# Patient Record
Sex: Female | Born: 1965 | Race: White | Hispanic: No | Marital: Married | Smoking: Never smoker
Health system: Southern US, Community
[De-identification: ages and names within clinical notes are randomized; demographics above are authoritative.]

---

## 2009-03-11 ENCOUNTER — Ambulatory Visit: Payer: Self-pay | Admitting: Obstetrics and Gynecology

## 2009-09-01 ENCOUNTER — Emergency Department: Payer: Self-pay | Admitting: Emergency Medicine

## 2009-09-08 ENCOUNTER — Emergency Department: Payer: Self-pay | Admitting: Emergency Medicine

## 2011-02-22 ENCOUNTER — Ambulatory Visit: Payer: Self-pay | Admitting: Obstetrics and Gynecology

## 2015-07-14 ENCOUNTER — Other Ambulatory Visit: Payer: Self-pay | Admitting: Family Medicine

## 2015-07-14 DIAGNOSIS — F419 Anxiety disorder, unspecified: Secondary | ICD-10-CM

## 2015-09-27 ENCOUNTER — Other Ambulatory Visit: Payer: Self-pay | Admitting: Family Medicine

## 2015-09-27 DIAGNOSIS — F419 Anxiety disorder, unspecified: Secondary | ICD-10-CM

## 2015-09-27 MED ORDER — SERTRALINE HCL 100 MG PO TABS: 100.0000 mg | ORAL_TABLET | Freq: Every day | ORAL | Status: DC

## 2016-01-23 ENCOUNTER — Encounter: Payer: Self-pay | Admitting: Family Medicine

## 2016-01-23 ENCOUNTER — Ambulatory Visit (INDEPENDENT_AMBULATORY_CARE_PROVIDER_SITE_OTHER): Payer: BLUE CROSS/BLUE SHIELD | Admitting: Family Medicine

## 2016-01-23 VITALS — BP 102/62 | HR 121 | Temp 98.9°F | Resp 17 | Wt 209.4 lb

## 2016-01-23 DIAGNOSIS — S060X9A Concussion with loss of consciousness of unspecified duration, initial encounter: Secondary | ICD-10-CM | POA: Insufficient documentation

## 2016-01-23 DIAGNOSIS — B349 Viral infection, unspecified: Secondary | ICD-10-CM

## 2016-01-23 DIAGNOSIS — F410 Panic disorder [episodic paroxysmal anxiety] without agoraphobia: Secondary | ICD-10-CM | POA: Insufficient documentation

## 2016-01-23 DIAGNOSIS — I889 Nonspecific lymphadenitis, unspecified: Secondary | ICD-10-CM | POA: Insufficient documentation

## 2016-01-23 DIAGNOSIS — S060XAA Concussion with loss of consciousness status unknown, initial encounter: Secondary | ICD-10-CM | POA: Insufficient documentation

## 2016-01-23 DIAGNOSIS — F411 Generalized anxiety disorder: Secondary | ICD-10-CM | POA: Insufficient documentation

## 2016-01-23 DIAGNOSIS — R52 Pain, unspecified: Secondary | ICD-10-CM | POA: Diagnosis not present

## 2016-01-23 DIAGNOSIS — E039 Hypothyroidism, unspecified: Secondary | ICD-10-CM | POA: Insufficient documentation

## 2016-01-23 LAB — POCT INFLUENZA A/B
Influenza A, POC: NEGATIVE
Influenza B, POC: NEGATIVE

## 2016-01-23 MED ORDER — HYDROCODONE-HOMATROPINE 5-1.5 MG/5ML PO SYRP: ORAL_SOLUTION | ORAL | Status: DC

## 2016-01-23 NOTE — Progress Notes (Signed)
Subjective:     Patient ID: Laurie Peterson, female   DOB: 1966/01/17, 50 y.o.   MRN: [id number]  HPI  Chief Complaint  Patient presents with  . Generalized Body Aches    Patient comes in office today with concerns of possibly having the flu virus. Patient reports that symptoms began 24hrs ago and throughout the night she felt feverish, sweats, headache and cough. Patient reports taking Tylenol, Mucinex, Advil and Vitamin C  States her husband has been sick and she has not had the flu vaccine. She work in a daycare. Review of Systems     Objective:   Physical Exam  Constitutional: She appears well-developed and well-nourished. No distress.  Ears: T.M's intact without inflammation Throat: no tonsillar enlargement or exudate Neck: no cervical adenopathy Lungs: clear with frequent cough in inspiration     Assessment:    1. Body aches - POCT Influenza A/B  2. Viral syndrome - HYDROcodone-homatropine (HYCODAN) 5-1.5 MG/5ML syrup; 5 ml 4-6 hours as needed for cough  Dispense: 240 mL; Refill: 0   Plan:    Discussed continued use of otc medication.

## 2016-01-23 NOTE — Patient Instructions (Signed)
Continue Mucinex and decongestants as needed. Consider Delsym for a non-sedating cough syrup.

## 2016-03-12 ENCOUNTER — Other Ambulatory Visit: Payer: Self-pay | Admitting: Family Medicine

## 2016-03-12 ENCOUNTER — Telehealth: Payer: Self-pay

## 2016-03-12 DIAGNOSIS — E039 Hypothyroidism, unspecified: Secondary | ICD-10-CM

## 2016-03-12 MED ORDER — LEVOTHYROXINE SODIUM 137 MCG PO TABS: 137.0000 ug | ORAL_TABLET | Freq: Every day | ORAL | Status: DC

## 2016-03-12 NOTE — Telephone Encounter (Signed)
[id number] 

## 2016-03-12 NOTE — Telephone Encounter (Signed)
-----   Message from Anola Gurneyobert Chauvin, [place] sent at 03/12/2016  2:02 PM EDT ----- I have refilled her thyroid medication but needs to update her labs. I sent a lab request to [facility]. She is to let us know if not there and we can print it out.

## 2016-03-13 NOTE — Telephone Encounter (Signed)
[id number] 

## 2016-03-15 NOTE — Telephone Encounter (Signed)
[id number] 

## 2016-03-21 NOTE — Telephone Encounter (Signed)
[id number] 

## 2016-06-04 ENCOUNTER — Other Ambulatory Visit: Payer: Self-pay | Admitting: Family Medicine

## 2016-06-04 DIAGNOSIS — F419 Anxiety disorder, unspecified: Secondary | ICD-10-CM

## 2016-06-04 MED ORDER — SERTRALINE HCL 100 MG PO TABS: 100.0000 mg | ORAL_TABLET | Freq: Every day | ORAL | Status: DC

## 2016-06-20 ENCOUNTER — Telehealth: Payer: Self-pay | Admitting: Family Medicine

## 2016-06-20 NOTE — Telephone Encounter (Signed)
Pt is requesting a lab slip for thyroid rechecked.  ZO#[phone]/WJCB#[phone]/MW

## 2016-06-20 NOTE — Telephone Encounter (Signed)
Left message that lab slip is ready for pick up

## 2016-06-20 NOTE — Telephone Encounter (Signed)
Lab slip up front for pickup. 

## 2016-06-21 DIAGNOSIS — E039 Hypothyroidism, unspecified: Secondary | ICD-10-CM | POA: Diagnosis not present

## 2016-06-29 ENCOUNTER — Other Ambulatory Visit: Payer: Self-pay | Admitting: Family Medicine

## 2016-06-29 DIAGNOSIS — E039 Hypothyroidism, unspecified: Secondary | ICD-10-CM

## 2016-06-29 DIAGNOSIS — F419 Anxiety disorder, unspecified: Secondary | ICD-10-CM

## 2016-06-29 MED ORDER — SERTRALINE HCL 100 MG PO TABS: 100.0000 mg | ORAL_TABLET | Freq: Every day | ORAL | Status: DC

## 2016-06-29 MED ORDER — LEVOTHYROXINE SODIUM 137 MCG PO TABS: 137.0000 ug | ORAL_TABLET | Freq: Every day | ORAL | Status: DC

## 2016-08-23 ENCOUNTER — Other Ambulatory Visit: Payer: Self-pay | Admitting: Family Medicine

## 2016-08-23 DIAGNOSIS — F419 Anxiety disorder, unspecified: Secondary | ICD-10-CM

## 2016-10-02 ENCOUNTER — Other Ambulatory Visit: Payer: Self-pay | Admitting: Family Medicine

## 2016-10-02 DIAGNOSIS — F419 Anxiety disorder, unspecified: Secondary | ICD-10-CM

## 2016-10-02 NOTE — Telephone Encounter (Signed)
Pt needs refill on her sertraline 100mg .  She is completely out.  She has an appt with Nadine CountsBob on Tuesday.  She [place] [facility] in [place]    Pt call back is [phone]  Thank s Barth Kirksteri

## 2016-10-02 NOTE — Telephone Encounter (Signed)
Please advise 

## 2016-10-03 NOTE — Telephone Encounter (Signed)
Patient has an appt with [name] 10/09/2016.

## 2016-10-09 ENCOUNTER — Encounter: Payer: Self-pay | Admitting: Family Medicine

## 2016-10-09 ENCOUNTER — Ambulatory Visit (INDEPENDENT_AMBULATORY_CARE_PROVIDER_SITE_OTHER): Payer: BLUE CROSS/BLUE SHIELD | Admitting: Family Medicine

## 2016-10-09 VITALS — BP 104/70 | HR 68 | Temp 98.0°F | Resp 16 | Wt 209.4 lb

## 2016-10-09 DIAGNOSIS — E039 Hypothyroidism, unspecified: Secondary | ICD-10-CM

## 2016-10-09 DIAGNOSIS — F419 Anxiety disorder, unspecified: Secondary | ICD-10-CM

## 2016-10-09 MED ORDER — LEVOTHYROXINE SODIUM 137 MCG PO TABS: 137.0000 ug | ORAL_TABLET | Freq: Every day | ORAL | 3 refills | Status: DC

## 2016-10-09 MED ORDER — SERTRALINE HCL 100 MG PO TABS: 100.0000 mg | ORAL_TABLET | Freq: Every day | ORAL | 3 refills | Status: DC

## 2016-10-09 NOTE — Patient Instructions (Signed)
Please update your health maintenance with your gyn. Consider colonoscopy in the next year.

## 2016-10-09 NOTE — Progress Notes (Signed)
Subjective:     Patient ID: Laurie Peterson, female   DOB: [birth year], 50 y.o.   MRN: [id number]  HPI  Chief Complaint  Patient presents with  . 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 Reports excellent control of anxiety with current sertraline dose and wishes to continue. Recent thyroid labs reviewed and stable. Defers flu shot and colonoscopy at this time. Encouraged her to [facility] where [facility] has not been in 4 years. States they usually update labs as well.    Review of Systems     Objective:   Physical Exam  Constitutional: She appears well-developed and well-nourished. No distress.  pleasant  Psychiatric: She has a normal mood and affect. Her behavior is normal.       Assessment:    1. Adult hypothyroidism - levothyroxine (SYNTHROID, LEVOTHROID) 137 MCG tablet; Take 1 tablet (137 mcg total) by mouth daily before breakfast.  Dispense: 90 tablet; Refill: 3  2. Anxiety - sertraline (ZOLOFT) 100 MG tablet; Take 1 tablet (100 mg total) by mouth daily.  Dispense: 90 tablet; Refill: 3    Plan:    Encouraged health maintenance.

## 2016-10-10 ENCOUNTER — Encounter: Payer: Self-pay | Admitting: Family Medicine

## 2017-10-11 ENCOUNTER — Other Ambulatory Visit: Payer: Self-pay | Admitting: Family Medicine

## 2017-10-11 DIAGNOSIS — E039 Hypothyroidism, unspecified: Secondary | ICD-10-CM

## 2017-10-11 DIAGNOSIS — F419 Anxiety disorder, unspecified: Secondary | ICD-10-CM

## 2017-10-14 ENCOUNTER — Telehealth: Payer: Self-pay | Admitting: Family Medicine

## 2017-10-14 DIAGNOSIS — Z23 Encounter for immunization: Secondary | ICD-10-CM | POA: Diagnosis not present

## 2017-10-14 NOTE — Telephone Encounter (Signed)
[facility] faxed a request for a refill on the following medication. Thanks CC  levothyroxine (SYNTHROID, LEVOTHROID) 137 MCG tablet

## 2017-10-15 NOTE — Telephone Encounter (Signed)
Filled 09/1917. KW

## 2017-11-26 ENCOUNTER — Other Ambulatory Visit: Payer: Self-pay | Admitting: Family Medicine

## 2017-11-26 ENCOUNTER — Other Ambulatory Visit: Payer: Self-pay | Admitting: Obstetrics and Gynecology

## 2017-11-26 DIAGNOSIS — Z0000 Encounter for general adult medical examination without abnormal findings: Secondary | ICD-10-CM | POA: Diagnosis not present

## 2017-11-26 DIAGNOSIS — Z1231 Encounter for screening mammogram for malignant neoplasm of breast: Secondary | ICD-10-CM | POA: Diagnosis not present

## 2017-11-26 DIAGNOSIS — Z1211 Encounter for screening for malignant neoplasm of colon: Secondary | ICD-10-CM | POA: Diagnosis not present

## 2017-11-26 DIAGNOSIS — F419 Anxiety disorder, unspecified: Secondary | ICD-10-CM

## 2017-11-26 DIAGNOSIS — Z01419 Encounter for gynecological examination (general) (routine) without abnormal findings: Secondary | ICD-10-CM | POA: Diagnosis not present

## 2017-11-26 LAB — HM PAP SMEAR: HM Pap smear: NEGATIVE

## 2017-11-27 ENCOUNTER — Other Ambulatory Visit: Payer: Self-pay | Admitting: Family Medicine

## 2017-11-27 NOTE — Telephone Encounter (Signed)
Prescription was filled on 11/26/17 and [facility].KW

## 2017-11-27 NOTE — Telephone Encounter (Signed)
[facility] faxed a refill request for the following medication. Thanks CC  sertraline (ZOLOFT) 100 MG tablet

## 2017-12-05 NOTE — Telephone Encounter (Signed)
[facility] called about refill. I advised that b/c pt hasn't requested us to [facility] pt would need to call and request the Rx. I tried to reach out to pt but no answer and no voicemail. Please advise. Thanks TNP

## 2017-12-05 NOTE — Telephone Encounter (Signed)
Unable to reach patient at this time no voice answering service. KW

## 2017-12-06 ENCOUNTER — Other Ambulatory Visit: Payer: Self-pay | Admitting: Family Medicine

## 2017-12-06 DIAGNOSIS — F419 Anxiety disorder, unspecified: Secondary | ICD-10-CM

## 2017-12-06 MED ORDER — SERTRALINE HCL 100 MG PO TABS: 100.0000 mg | ORAL_TABLET | Freq: Every day | ORAL | 0 refills | Status: DC

## 2017-12-06 NOTE — Telephone Encounter (Signed)
Patient advised.KW 

## 2017-12-06 NOTE — Telephone Encounter (Signed)
Patient states that she didn't get a chance to pick up prescription on 11/26/17 and that she only had a one day window with her insurance to get Rx at pharmacy or it would not be covered under insurance. Patient states that insurance told her in order for medication to be covered it would have to be mail ordered. Patient is [facility]. KW

## 2017-12-06 NOTE — Telephone Encounter (Signed)
Pt is returning call.  Pt gave a new Phone #.  ZO#[phone]/WJCB#[phone]/MW

## 2017-12-06 NOTE — Telephone Encounter (Signed)
I have sent in sertraline to [facility].Still need to see her before further refills for anxiety and her thyroid.

## 2017-12-09 ENCOUNTER — Other Ambulatory Visit: Payer: Self-pay | Admitting: Family Medicine

## 2017-12-09 DIAGNOSIS — E039 Hypothyroidism, unspecified: Secondary | ICD-10-CM

## 2017-12-18 ENCOUNTER — Inpatient Hospital Stay: Payer: BLUE CROSS/BLUE SHIELD | Admitting: Family Medicine

## 2017-12-25 ENCOUNTER — Ambulatory Visit
Admission: RE | Admit: 2017-12-25 | Discharge: 2017-12-25 | Disposition: A | Discharge status: Home / Self Care | Payer: BLUE CROSS/BLUE SHIELD | Source: Ambulatory Visit | Attending: Obstetrics and Gynecology | Admitting: Obstetrics and Gynecology

## 2017-12-25 DIAGNOSIS — Z1231 Encounter for screening mammogram for malignant neoplasm of breast: Secondary | ICD-10-CM | POA: Diagnosis not present

## 2018-01-01 DIAGNOSIS — Z01818 Encounter for other preprocedural examination: Secondary | ICD-10-CM | POA: Diagnosis not present

## 2018-01-01 DIAGNOSIS — Z8371 Family history of colonic polyps: Secondary | ICD-10-CM | POA: Diagnosis not present

## 2018-01-01 DIAGNOSIS — Z1211 Encounter for screening for malignant neoplasm of colon: Secondary | ICD-10-CM | POA: Diagnosis not present

## 2018-01-24 ENCOUNTER — Other Ambulatory Visit: Payer: Self-pay | Admitting: Family Medicine

## 2018-01-24 DIAGNOSIS — F419 Anxiety disorder, unspecified: Secondary | ICD-10-CM

## 2018-02-14 DIAGNOSIS — K648 Other hemorrhoids: Secondary | ICD-10-CM | POA: Diagnosis not present

## 2018-02-14 DIAGNOSIS — Z1211 Encounter for screening for malignant neoplasm of colon: Secondary | ICD-10-CM | POA: Diagnosis not present

## 2018-02-14 DIAGNOSIS — Z8371 Family history of colonic polyps: Secondary | ICD-10-CM | POA: Diagnosis not present

## 2018-02-14 LAB — HM COLONOSCOPY

## 2018-02-26 ENCOUNTER — Encounter: Payer: Self-pay | Admitting: Physician Assistant

## 2018-02-26 ENCOUNTER — Ambulatory Visit (INDEPENDENT_AMBULATORY_CARE_PROVIDER_SITE_OTHER): Payer: BLUE CROSS/BLUE SHIELD | Admitting: Physician Assistant

## 2018-02-26 VITALS — BP 110/82 | HR 90 | Temp 97.9°F | Resp 20 | Wt 212.0 lb

## 2018-02-26 DIAGNOSIS — R05 Cough: Secondary | ICD-10-CM | POA: Diagnosis not present

## 2018-02-26 DIAGNOSIS — R059 Cough, unspecified: Secondary | ICD-10-CM

## 2018-02-26 DIAGNOSIS — E039 Hypothyroidism, unspecified: Secondary | ICD-10-CM

## 2018-02-26 NOTE — Progress Notes (Signed)
Patient: Laurie Peterson Female    DOB: 1966/03/16   52 y.o.   MRN: [id number] Visit Date: 02/26/2018  Today's Provider: Trey SailorsAdriana M Rainna Nearhood, PA-C   Chief Complaint  Patient presents with  . Cough   Subjective:    Laurie Mainlandara M Erhart is a 52 y/o woman presenting today for a cough x 1 day and hypothyroid.   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 She has had dry cough x 1 day with pertinent info below.   She has a history of hypothyroidism. She is on 137 mcg synthroid. Her last TSH is not visible in Epic. She normally sees Nadine CountsBob who declined refilling her medications before her getting more labs. She denies fatigue, weight gain, trouble swallowing.  Cough  This is a new problem. The current episode started yesterday. The problem has been gradually worsening. The cough is productive of sputum. Associated symptoms include headaches, nasal congestion and postnasal drip. Pertinent negatives include no fever. She has tried nothing for the symptoms. There is no history of asthma or environmental allergies.       No Known Allergies   Current Outpatient Medications:  .  levothyroxine (SYNTHROID, LEVOTHROID) 137 MCG tablet, TAKE 1 TABLET BY MOUTH  DAILY BEFORE BREAKFAST, Disp: 90 tablet, Rfl: 0 .  sertraline (ZOLOFT) 100 MG tablet, Take 1 tablet (100 mg total) by mouth daily., Disp: 90 tablet, Rfl: 0  Review of Systems  Constitutional: Negative for fever.  HENT: Positive for postnasal drip.   Respiratory: Positive for cough.   Allergic/Immunologic: Negative for environmental allergies.  Neurological: Positive for headaches.    Social History   Tobacco Use  . Smoking status: Never Smoker  . Smokeless tobacco: Never Used  Substance Use Topics  . Alcohol use: No    Alcohol/week: 0.0 oz   Objective:   BP 110/82   Pulse 90   Temp 97.9 F (36.6 C)   Resp 20   Wt 212 lb (96.2 kg)   SpO2 98%  Vitals:   02/26/18 1450  BP: 110/82  Pulse: 90  Resp: 20  Temp: 97.9  F (36.6 C)  SpO2: 98%  Weight: 212 lb (96.2 kg)     Physical Exam  Constitutional: She appears well-developed and well-nourished.  HENT:  Right Ear: External ear normal.  Left Ear: External ear normal.  Mouth/Throat: Oropharynx is clear and moist. No oropharyngeal exudate.  Eyes: Right eye exhibits discharge. Left eye exhibits discharge.  Neck: Neck supple. No thyromegaly present.  Cardiovascular: Normal rate and regular rhythm.  Pulmonary/Chest: Effort normal and breath sounds normal. No respiratory distress. She has no rales.  Lymphadenopathy:    She has no cervical adenopathy.  Skin: Skin is warm and dry.  Psychiatric: She has a normal mood and affect. Her behavior is normal.        Assessment & Plan:     1. Cough  Viral URI, counseled on OTC medications.  2. Adult hypothyroidism  Stressed importance of following up with labwork for hypothyroidism, which is chronic disease.  - TSH  Return in about 6 months (around 08/29/2018) for hypothyroidism.  The entirety of the information documented in the History of Present Illness, Review of Systems and Physical Exam were personally obtained by me. Portions of this information were initially documented by Thresa Rossachelle P, CMA and reviewed by me for thoroughness and accuracy.         Trey SailorsAdriana M Yeraldine Forney, PA-C  [facility]  

## 2018-02-26 NOTE — Patient Instructions (Signed)
Hypothyroidism Hypothyroidism is a disorder of the thyroid. The thyroid is a large gland that is located in the lower front of the neck. The thyroid releases hormones that control how the body works. With hypothyroidism, the thyroid does not make enough of these hormones. What are the causes? Causes of hypothyroidism may include:  Viral infections.  Pregnancy.  Your own defense system (immune system) attacking your thyroid.  Certain medicines.  Birth defects.  Past radiation treatments to your head or neck.  Past treatment with radioactive iodine.  Past surgical removal of part or all of your thyroid.  Problems with the gland that is located in the center of your brain (pituitary).  What are the signs or symptoms? Signs and symptoms of hypothyroidism may include:  Feeling as though you have no energy (lethargy).  Inability to tolerate cold.  Weight gain that is not explained by a change in diet or exercise habits.  Dry skin.  Coarse hair.  Menstrual irregularity.  Slowing of thought processes.  Constipation.  Sadness or depression.  How is this diagnosed? Your health care provider may diagnose hypothyroidism with blood tests and ultrasound tests. How is this treated? Hypothyroidism is treated with medicine that replaces the hormones that your body does not make. After you begin treatment, it may take several weeks for symptoms to go away. Follow these instructions at home:  Take medicines only as directed by your health care provider.  If you start taking any new medicines, tell your health care provider.  Keep all follow-up visits as directed by your health care provider. This is important. As your condition improves, your dosage needs may change. You will need to have blood tests regularly so that your health care provider can watch your condition. Contact a health care provider if:  Your symptoms do not get better with treatment.  You are taking thyroid  replacement medicine and: ? You sweat excessively. ? You have tremors. ? You feel anxious. ? You lose weight rapidly. ? You cannot tolerate heat. ? You have emotional swings. ? You have diarrhea. ? You feel weak. Get help right away if:  You develop chest pain.  You develop an irregular heartbeat.  You develop a rapid heartbeat. This information is not intended to replace advice given to you by your health care provider. Make sure you discuss any questions you have with your health care provider. Document Released: 12/10/2005 Document Revised: 05/17/2016 Document Reviewed: 04/27/2014 Elsevier Interactive Patient Education  2018 Elsevier Inc.  

## 2018-02-27 ENCOUNTER — Other Ambulatory Visit: Payer: Self-pay | Admitting: Family Medicine

## 2018-02-27 ENCOUNTER — Telehealth: Payer: Self-pay

## 2018-02-27 DIAGNOSIS — E039 Hypothyroidism, unspecified: Secondary | ICD-10-CM

## 2018-02-27 LAB — TSH: TSH: 3.83 u[IU]/mL (ref 0.450–4.500)

## 2018-02-27 MED ORDER — LEVOTHYROXINE SODIUM 137 MCG PO TABS: ORAL_TABLET | ORAL | 3 refills | Status: DC

## 2018-02-27 NOTE — Telephone Encounter (Signed)
Patient has been advised. KW 

## 2018-02-27 NOTE — Telephone Encounter (Signed)
-----   Message from Anola Gurneyobert Chauvin, [place] sent at 02/27/2018  1:27 PM EST ----- Let patient know I have sent in thyroid [facility] as her thyroid test was ok.

## 2018-03-03 ENCOUNTER — Ambulatory Visit (INDEPENDENT_AMBULATORY_CARE_PROVIDER_SITE_OTHER): Payer: BLUE CROSS/BLUE SHIELD | Admitting: Family Medicine

## 2018-03-03 ENCOUNTER — Encounter: Payer: Self-pay | Admitting: Family Medicine

## 2018-03-03 VITALS — BP 122/90 | HR 106 | Temp 98.2°F | Resp 16

## 2018-03-03 DIAGNOSIS — J0100 Acute maxillary sinusitis, unspecified: Secondary | ICD-10-CM

## 2018-03-03 MED ORDER — AMOXICILLIN-POT CLAVULANATE 875-125 MG PO TABS: 1.0000 | ORAL_TABLET | Freq: Two times a day (BID) | ORAL | 0 refills | Status: DC

## 2018-03-03 NOTE — Patient Instructions (Signed)
Discussed use of Mucinex D for congestion and Delsym for cough. Let me know if you wish to go down on your sertraline to 50 mg.at next refill.

## 2018-03-03 NOTE — Progress Notes (Signed)
Subjective:     Patient ID: Laurie Peterson, female   DOB: 1966-06-09, 52 y.o.   MRN: [id number] Chief Complaint  Patient presents with  . Sinus Problem    Patient comes into office today with complaints of sinus pain and pressure for the past 5 days, patient reports Saturday symptoms got worse. Patient reports that she has developed a cough and has been taking otc Sudafed, she denies shortness of breath or wheezing. Marland Kitchen.    HPI Developed cold sx one weeks ago. Patient reports increased right sided sinus pressure, purulent sinus drainage, post nasal drainage and accompanying cough. Has been taking Sudafed and Theraflu for her sx. Continues to work in a daycare Review of Systems  Constitutional:       Health maintenance thorough [place] including colonoscopy.       Objective:   Physical Exam  Constitutional: She appears well-developed and well-nourished.  Ears: T.M's intact without inflammation Sinuses: moderate right maxillary sinus tenderness Throat: no tonsillar enlargement or exudate Neck: no cervical adenopathy Lungs: clear     Assessment:    1. Acute maxillary sinusitis, recurrence not specified - amoxicillin-clavulanate (AUGMENTIN) 875-125 MG tablet; Take 1 tablet by mouth 2 (two) times daily.    Plan:    Discussed use of Mucinex D and Delsym. She will call regarding success with tapering sertraline to 50 mg at time of refill.

## 2018-04-07 ENCOUNTER — Telehealth: Payer: Self-pay | Admitting: Family Medicine

## 2018-04-07 ENCOUNTER — Other Ambulatory Visit: Payer: Self-pay | Admitting: Family Medicine

## 2018-04-07 DIAGNOSIS — F419 Anxiety disorder, unspecified: Secondary | ICD-10-CM

## 2018-04-07 MED ORDER — SERTRALINE HCL 100 MG PO TABS: 100.0000 mg | ORAL_TABLET | Freq: Every day | ORAL | 0 refills | Status: DC

## 2018-04-07 NOTE — Telephone Encounter (Signed)
States she does not take it every day and is willing to try 1/2 pill. She will call about tapering further.

## 2018-04-07 NOTE — Telephone Encounter (Signed)
According to last refill on 12/06/17 it stated in comments that patient need office visit, I called patient to schedule follow up for anxiety and she states that she was just seen in office on 03/03/18. I advised patient she was seen for a sick visit and not follow up and patient states that you told her that you would accept her follow up at that visit. Please review note and advise. KW

## 2018-04-07 NOTE — Telephone Encounter (Signed)
Pt needs a refill on her Sertraline 100.mg   She uses [facility]  Pt's call back is [phone]  Thanks Fortune Brandsteri

## 2018-05-26 ENCOUNTER — Other Ambulatory Visit: Payer: Self-pay | Admitting: Family Medicine

## 2018-05-26 DIAGNOSIS — F419 Anxiety disorder, unspecified: Secondary | ICD-10-CM

## 2018-08-26 ENCOUNTER — Other Ambulatory Visit: Payer: Self-pay | Admitting: Family Medicine

## 2018-08-26 ENCOUNTER — Telehealth: Payer: Self-pay | Admitting: Family Medicine

## 2018-08-26 MED ORDER — SERTRALINE HCL 50 MG PO TABS: 50.0000 mg | ORAL_TABLET | Freq: Every day | ORAL | 1 refills | Status: DC

## 2018-08-26 NOTE — Telephone Encounter (Signed)
Needs refill on Sertraline 50 mg. To online pharmacy [facility]

## 2018-08-26 NOTE — Telephone Encounter (Signed)
done

## 2018-08-26 NOTE — Telephone Encounter (Signed)
Medication last filled 05/26/18, last office visit for anxiety was 10/09/16.KW

## 2018-08-26 NOTE — Telephone Encounter (Signed)
She has been on 100 mg. Is she trying to taper down to 50 mg?

## 2018-08-26 NOTE — Telephone Encounter (Signed)
Patient states that she tapered her self down to 50mg  3 months ago stating that her symptoms have greatly improved and request to continue on the 50mg . Renette Butters

## 2018-11-11 ENCOUNTER — Encounter: Payer: Self-pay | Admitting: Family Medicine

## 2018-11-11 ENCOUNTER — Ambulatory Visit (INDEPENDENT_AMBULATORY_CARE_PROVIDER_SITE_OTHER): Payer: BLUE CROSS/BLUE SHIELD | Admitting: Family Medicine

## 2018-11-11 VITALS — BP 116/64 | HR 60 | Temp 98.1°F | Resp 16 | Wt 214.4 lb

## 2018-11-11 DIAGNOSIS — J40 Bronchitis, not specified as acute or chronic: Secondary | ICD-10-CM

## 2018-11-11 MED ORDER — AZITHROMYCIN 250 MG PO TABS: ORAL_TABLET | ORAL | 0 refills | Status: DC

## 2018-11-11 NOTE — Patient Instructions (Signed)
Start Mucinex DM. Let me know if not improving.

## 2018-11-11 NOTE — Progress Notes (Signed)
  Subjective:     Patient ID: Laurie Peterson, female   DOB: 1966-01-12, 52 y.o.   MRN: [id number] Chief Complaint  Patient presents with  . Cough    Patient comes in office today with complaints of cough and chest congestion x2 weeks. Patient states associated with cough she has had shortness of breath and wheezing. Patien has been taking otc Mucinex D.    HPI States she has not had fever, chills, sore throat or sinus congestion. She intends to get the flu shot when she gets better  Review of Systems  Psychiatric/Behavioral:       Anxiety controlled by sertraline. Does not wish to taper during the holidays       Objective:   Physical Exam  Constitutional: She appears well-developed and well-nourished. No distress.  Ears: T.M's intact without inflammation Throat: no tonsillar enlargement or exudate Neck: no cervical adenopathy Lungs: mild posterior lung field wheezes     Assessment:    1. Bronchitis: Z pack     Plan:    Samples of Mucinex DM

## 2018-11-12 ENCOUNTER — Ambulatory Visit: Payer: Self-pay | Admitting: Physician Assistant

## 2019-01-07 ENCOUNTER — Other Ambulatory Visit: Payer: Self-pay | Admitting: Obstetrics & Gynecology

## 2019-01-07 DIAGNOSIS — E039 Hypothyroidism, unspecified: Secondary | ICD-10-CM | POA: Diagnosis not present

## 2019-01-07 DIAGNOSIS — Z1211 Encounter for screening for malignant neoplasm of colon: Secondary | ICD-10-CM | POA: Diagnosis not present

## 2019-01-07 DIAGNOSIS — Z01419 Encounter for gynecological examination (general) (routine) without abnormal findings: Secondary | ICD-10-CM | POA: Diagnosis not present

## 2019-01-07 DIAGNOSIS — Z13220 Encounter for screening for lipoid disorders: Secondary | ICD-10-CM | POA: Diagnosis not present

## 2019-01-07 DIAGNOSIS — Z1231 Encounter for screening mammogram for malignant neoplasm of breast: Secondary | ICD-10-CM

## 2019-01-07 DIAGNOSIS — R5383 Other fatigue: Secondary | ICD-10-CM | POA: Diagnosis not present

## 2019-01-07 DIAGNOSIS — Z0000 Encounter for general adult medical examination without abnormal findings: Secondary | ICD-10-CM | POA: Diagnosis not present

## 2019-01-07 LAB — BASIC METABOLIC PANEL
BUN: 15 (ref 4–21)
Creatinine: 0.7 (ref 0.5–1.1)
GLUCOSE: 82
Potassium: 4 (ref 3.4–5.3)
Sodium: 139 (ref 137–147)

## 2019-01-07 LAB — LIPID PANEL
Cholesterol: 195 (ref 0–200)
HDL: 32 — AB (ref 35–70)
LDL CALC: 122
Triglycerides: 206 — AB (ref 40–160)

## 2019-01-07 LAB — TSH: TSH: 0.78 (ref 0.41–5.90)

## 2019-01-07 LAB — HEPATIC FUNCTION PANEL
ALT: 10 (ref 7–35)
AST: 11 — AB (ref 13–35)
Alkaline Phosphatase: 50 (ref 25–125)
Bilirubin, Total: 0.5

## 2019-01-07 LAB — CBC AND DIFFERENTIAL
HEMATOCRIT: 42 (ref 36–46)
HEMOGLOBIN: 13.7 (ref 12.0–16.0)
PLATELETS: 267 (ref 150–399)
WBC: 6.4

## 2019-01-07 LAB — VITAMIN B12: VITAMIN B 12: 316

## 2019-01-07 LAB — FECAL OCCULT BLOOD, GUAIAC: Fecal Occult Blood: NEGATIVE

## 2019-01-08 ENCOUNTER — Other Ambulatory Visit: Payer: Self-pay | Admitting: Family Medicine

## 2019-01-08 ENCOUNTER — Telehealth: Payer: Self-pay | Admitting: Family Medicine

## 2019-01-08 MED ORDER — LEVOTHYROXINE SODIUM 137 MCG PO CAPS: 1.0000 | ORAL_CAPSULE | Freq: Every day | ORAL | 0 refills | Status: DC

## 2019-01-08 NOTE — Telephone Encounter (Signed)
Pt having insurance coverage issues.  Pt asking for 2 week supply refill until her insurance is corrected.  Thanks, [employer]

## 2019-01-08 NOTE — Telephone Encounter (Signed)
Please review

## 2019-01-08 NOTE — Telephone Encounter (Signed)
done

## 2019-01-23 ENCOUNTER — Other Ambulatory Visit: Payer: Self-pay | Admitting: Family Medicine

## 2019-01-23 ENCOUNTER — Telehealth: Payer: Self-pay

## 2019-01-23 MED ORDER — LEVOTHYROXINE SODIUM 137 MCG PO CAPS: 1.0000 | ORAL_CAPSULE | Freq: Every day | ORAL | 0 refills | Status: DC

## 2019-01-23 NOTE — Telephone Encounter (Signed)
Please review

## 2019-01-23 NOTE — Telephone Encounter (Signed)
Patient called office requesting refill on Levothyroxine. Patient reports that she is still dealing with coverage for health insurance and is requesting that you call in a 2 week prescription for Levothyroxine to [facility] in [place]. Please review KW

## 2019-01-23 NOTE — Telephone Encounter (Signed)
done

## 2019-01-27 ENCOUNTER — Ambulatory Visit
Admission: RE | Admit: 2019-01-27 | Discharge: 2019-01-27 | Disposition: A | Discharge status: Home / Self Care | Payer: BLUE CROSS/BLUE SHIELD | Source: Ambulatory Visit | Attending: Obstetrics & Gynecology | Admitting: Obstetrics & Gynecology

## 2019-01-27 DIAGNOSIS — Z1231 Encounter for screening mammogram for malignant neoplasm of breast: Secondary | ICD-10-CM | POA: Insufficient documentation

## 2019-02-20 ENCOUNTER — Encounter: Payer: Self-pay | Admitting: Family Medicine

## 2019-02-20 ENCOUNTER — Ambulatory Visit (INDEPENDENT_AMBULATORY_CARE_PROVIDER_SITE_OTHER): Payer: BLUE CROSS/BLUE SHIELD | Admitting: Family Medicine

## 2019-02-20 VITALS — BP 116/71 | HR 79 | Temp 97.7°F | Wt 191.0 lb

## 2019-02-20 DIAGNOSIS — H109 Unspecified conjunctivitis: Secondary | ICD-10-CM

## 2019-02-20 DIAGNOSIS — B9689 Other specified bacterial agents as the cause of diseases classified elsewhere: Secondary | ICD-10-CM

## 2019-02-20 MED ORDER — POLYMYXIN B-TRIMETHOPRIM 10000-0.1 UNIT/ML-% OP SOLN: 2.0000 [drp] | Freq: Four times a day (QID) | OPHTHALMIC | 0 refills | Status: AC

## 2019-02-20 NOTE — Patient Instructions (Signed)
Bacterial Conjunctivitis, Adult  Bacterial conjunctivitis is an infection of your conjunctiva. This is the clear membrane that covers the white part of your eye and the inner part of your eyelid. This infection can make your eye:  · Red or pink.  · Itchy.  This condition spreads easily from person to person (is contagious) and from one eye to the other eye.  What are the causes?  · This condition is caused by germs (bacteria). You may get the infection if you come into close contact with:  ? A person who has the infection.  ? Items that have germs on them (are contaminated), such as face towels, contact lens solution, or eye makeup.  What increases the risk?  You are more likely to get this condition if you:  · Have contact with people who have the infection.  · Wear contact lenses.  · Have a sinus infection.  · Have had a recent eye injury or surgery.  · Have a weak body defense system (immune system).  · Have dry eyes.  What are the signs or symptoms?    · Thick, yellowish discharge from the eye.  · Tearing or watery eyes.  · Itchy eyes.  · Burning feeling in your eyes.  · Eye redness.  · Swollen eyelids.  · Blurred vision.  How is this treated?    · Antibiotic eye drops or ointment.  · Antibiotic medicine taken by mouth. This is used for infections that do not get better with drops or ointment or that last more than 10 days.  · Cool, wet cloths placed on the eyes.  · Artificial tears used 2-6 times a day.  Follow these instructions at home:  Medicines  · Take or apply your antibiotic medicine as told by your doctor. Do not stop taking or applying the antibiotic even if you start to feel better.  · Take or apply over-the-counter and prescription medicines only as told by your doctor.  · Do not touch your eyelid with the eye-drop bottle or the ointment tube.  Managing discomfort  · Wipe any fluid from your eye with a warm, wet washcloth or a cotton ball.  · Place a clean, cool, wet cloth on your eye. Do this for  10-20 minutes, 3-4 times per day.  General instructions  · Do not wear contacts until the infection is gone. Wear glasses until your doctor says it is okay to wear contacts again.  · Do not wear eye makeup until the infection is gone. Throw away old eye makeup.  · Change or wash your pillowcase every day.  · Do not share towels or washcloths.  · Wash your hands often with soap and water. Use paper towels to dry your hands.  · Do not touch or rub your eyes.  · Do not drive or use heavy machinery if your vision is blurred.  Contact a doctor if:  · You have a fever.  · You do not get better after 10 days.  Get help right away if:  · You have a fever and your symptoms get worse all of a sudden.  · You have very bad pain when you move your eye.  · Your face:  ? Hurts.  ? Is red.  ? Is swollen.  · You have sudden loss of vision.  Summary  · Bacterial conjunctivitis is an infection of your conjunctiva.  · This infection spreads easily from person to person.  · Wash your hands often   with soap and water. Use paper towels to dry your hands.  · Take or apply your antibiotic medicine as told by your doctor.  · Contact a doctor if you have a fever or you do not get better after 10 days.  This information is not intended to replace advice given to you by your health care provider. Make sure you discuss any questions you have with your health care provider.  Document Released: 09/18/2008 Document Revised: 07/16/2018 Document Reviewed: 07/16/2018  Elsevier Interactive Patient Education © 2019 Elsevier Inc.

## 2019-02-20 NOTE — Progress Notes (Signed)
Patient: Laurie Peterson Female    DOB: 04-20-1966   53 y.o.   MRN: [id number] Visit Date: 02/20/2019  Today's Provider: Shirlee Latch, MD   Chief Complaint  Patient presents with  . Eye Problem   Subjective:    I, Presley Raddle, CMA, am acting as a scribe for Shirlee Latch, MD.   Eye Problem   The right eye is affected. This is a new problem. The current episode started today. The problem occurs constantly. There was no injury mechanism. There is known exposure to pink eye. She does not wear contacts. Associated symptoms include an eye discharge, eye redness and itching. She has tried nothing for the symptoms.   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  Levothyroxine Sodium 137 MCG CAPS, Take 1 capsule (137 mcg total) by mouth daily before breakfast., Disp: 14 capsule, Rfl: 0 .  sertraline (ZOLOFT) 50 MG tablet, Take 1 tablet (50 mg total) by mouth daily., Disp: 90 tablet, Rfl: 1   Review of Systems  Constitutional: Negative.   Eyes: Positive for discharge, redness and itching.  Respiratory: Negative.   Cardiovascular: Negative.   Musculoskeletal: Negative.     Social History   Tobacco Use  . Smoking status: Never Smoker  . Smokeless tobacco: Never Used  Substance Use Topics  . Alcohol use: No    Alcohol/week: 0.0 standard drinks      Objective:   There were no vitals taken for this visit. There were no vitals filed for this visit.   Physical Exam Vitals signs reviewed.  Constitutional:      General: She is not in acute distress.    Appearance: Normal appearance. She is not diaphoretic.  HENT:     Head: Normocephalic and atraumatic.     Right Ear: External ear normal.     Left Ear: External ear normal.     Nose: Nose normal.     Mouth/Throat:     Mouth: Mucous membranes are moist.     Pharynx: Oropharynx is clear.  Eyes:    General: Lids are normal. Lids are everted, no foreign bodies appreciated. No scleral icterus.    Extraocular Movements: Extraocular movements intact.     Conjunctiva/sclera:     Right eye: Right conjunctiva is injected.     Left eye: Left conjunctiva is injected.     Pupils: Pupils are equal, round, and reactive to light.     Comments: Right conjunctival worse than left  Neck:     Musculoskeletal: Neck supple.  Cardiovascular:     Rate and Rhythm: Normal rate and regular rhythm.     Heart sounds: Normal heart sounds.  Pulmonary:     Effort: Pulmonary effort is normal. No respiratory distress.     Breath sounds: Normal breath sounds.  Lymphadenopathy:     Cervical: No cervical adenopathy.  Skin:    General: Skin is warm and dry.     Capillary Refill: Capillary refill takes less than 2 seconds.     Findings: No rash.  Neurological:     Mental Status: She is alert and oriented to person, place, and time.  Psychiatric:        Mood and Affect: Mood normal.        Behavior: Behavior normal.         Assessment & Plan   1. Bacterial conjunctivitis of  both eyes -New problem - Known exposure to bacterial conjunctivitis with new conjunctival injection -No red flag symptoms with vision intact, EOM intact, and no eye pain - We will treat with Polytrim for 5 to 7 days -Discussed natural course and return precautions    Meds ordered this encounter  Medications  . trimethoprim-polymyxin b (POLYTRIM) ophthalmic solution    Sig: Place 2 drops into both eyes every 6 (six) hours for 7 days.    Dispense:  10 mL    Refill:  0     Return if symptoms worsen or fail to improve.   The entirety of the information documented in the History of Present Illness, Review of Systems and Physical Exam were personally obtained by me. Portions of this information were initially documented by Presley Raddle, CMA and reviewed by me for thoroughness and accuracy.    Erasmo Downer, MD,  [facility] 02/21/2019 10:43 AM

## 2019-03-09 ENCOUNTER — Telehealth: Payer: Self-pay | Admitting: Family Medicine

## 2019-03-09 MED ORDER — OFLOXACIN 0.3 % OP SOLN: 1.0000 [drp] | Freq: Four times a day (QID) | OPHTHALMIC | 0 refills | Status: AC

## 2019-03-09 NOTE — Telephone Encounter (Signed)
Is she still having drainage?  Bacterila pink eye is well treated with this antibiotic.  This could be allergic

## 2019-03-09 NOTE — Telephone Encounter (Signed)
If it does no clear up with new antibiotic drops, needs to be re-evaluated possibly by Optho.  Rx sent

## 2019-03-09 NOTE — Telephone Encounter (Signed)
LVMTRC 

## 2019-03-09 NOTE — Telephone Encounter (Signed)
Pt called saying the pink eye has came back.  She came in two weeks ago and would like to get another eye drop or antibiotic. She said the one she had been using is just not clearing it up all the way,  She uses [facility]  CB# [phone]    Thanks Barth Kirks

## 2019-03-09 NOTE — Telephone Encounter (Signed)
Patient advised.

## 2019-03-09 NOTE — Telephone Encounter (Signed)
Pt reports she still has drainage.  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  She says "I have had it several times before and this is pink eye".  Please advise.   Thanks,   -Vernona Rieger

## 2019-03-27 ENCOUNTER — Other Ambulatory Visit: Payer: Self-pay

## 2019-03-27 MED ORDER — SERTRALINE HCL 50 MG PO TABS: 50.0000 mg | ORAL_TABLET | Freq: Every day | ORAL | 0 refills | Status: DC

## 2019-03-27 NOTE — Telephone Encounter (Signed)
LVMTRC 

## 2019-03-27 NOTE — Telephone Encounter (Signed)
We should get her a f/u anxiety evisit soon. Looks liek it's been a long time sinceBob saw her for this.

## 2019-03-30 ENCOUNTER — Encounter: Payer: Self-pay | Admitting: Family Medicine

## 2019-03-30 ENCOUNTER — Ambulatory Visit (INDEPENDENT_AMBULATORY_CARE_PROVIDER_SITE_OTHER): Payer: BLUE CROSS/BLUE SHIELD | Admitting: Family Medicine

## 2019-03-30 DIAGNOSIS — F411 Generalized anxiety disorder: Secondary | ICD-10-CM

## 2019-03-30 DIAGNOSIS — Z7189 Other specified counseling: Secondary | ICD-10-CM

## 2019-03-30 NOTE — Assessment & Plan Note (Signed)
Well controlled Continue zoloft at current dose Given current stressors will avoid further tapering at this time Return precautions discussed f/u in 6 months

## 2019-03-30 NOTE — Progress Notes (Signed)
Patient: Laurie Peterson Female    DOB: 23-Oct-1966   53 y.o.   MRN: [id number] Visit Date: 03/30/2019  Today's Provider: Shirlee Latch, MD   Chief Complaint  Patient presents with  . Anxiety   Subjective:    Virtual Visit via Video Note  I connected with Stann Mainland on 03/30/19 at  9:40 AM EDT by a video enabled telemedicine application and verified that I am speaking with the correct person using two identifiers.   I discussed the limitations of evaluation and management by telemedicine and the availability of in person appointments. The patient expressed understanding and agreed to proceed.   Patient location: home Provider location: home   Anxiety  Presents for follow-up visit. Symptoms include nervous/anxious behavior. The quality of sleep is good. Nighttime awakenings: none.   Compliance with medications is 76-100%.   Zoloft is working well for anxiety.  She tried to wean down over the last year.  She went down from 100mg  to 50mg  daily. 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   GAD 7 : Generalized Anxiety Score 03/30/2019  Nervous, Anxious, on Edge 1  Control/stop worrying 0  Worry too much - different things 0  Trouble relaxing 0  Restless 1  Easily annoyed or irritable 0  Afraid - awful might happen 1  Total GAD 7 Score 3  Anxiety Difficulty Not difficult at all      No Known Allergies   Current Outpatient Medications:  .  Levothyroxine Sodium 137 MCG CAPS, Take 1 capsule (137 mcg total) by mouth daily before breakfast., Disp: 14 capsule, Rfl: 0 .  sertraline (ZOLOFT) 50 MG tablet, Take 1 tablet (50 mg total) by mouth daily., Disp: 90 tablet, Rfl: 0  Review of Systems  Constitutional: Negative.   Respiratory: Negative.   Cardiovascular: Negative.   Musculoskeletal: Negative.   Psychiatric/Behavioral: The  patient is nervous/anxious.     Social History   Tobacco Use  . Smoking status: Never Smoker  . Smokeless tobacco: Never Used  Substance Use Topics  . Alcohol use: No    Alcohol/week: 0.0 standard drinks      Objective:   There were no vitals taken for this visit. There were no vitals filed for this visit.   Physical Exam Constitutional:      General: She is not in acute distress.    Appearance: Normal appearance. She is not diaphoretic.  HENT:     Head: Normocephalic and atraumatic.  Eyes:     Conjunctiva/sclera: Conjunctivae normal.  Pulmonary:     Effort: Pulmonary effort is normal. No respiratory distress.  Neurological:     Mental Status: She is alert and oriented to person, place, and time. Mental status is at baseline.  Psychiatric:        Mood and Affect: Mood normal.        Behavior: Behavior normal.        Thought Content: Thought content normal.       Assessment & Plan   Problem List Items Addressed This Visit      Other   GAD (generalized anxiety disorder) - Primary    Well controlled Continue zoloft at current dose Given current stressors will avoid further tapering at this time Return precautions discussed f/u in 6 months  Other Visit Diagnoses    Advice Given About Covid-19 Virus Infection        - discussed hand hygiene, avoidance of touching the face, wearing masks when in public, washing clothes and showering as soon as getting home, no shoes in the house - discussed what symptoms to watch for and return precautions   Return in about 6 months (around 09/29/2019) for anxiety f/u.   The entirety of the information documented in the History of Present Illness, Review of Systems and Physical Exam were personally obtained by me. Portions of this information were initially documented by Presley Raddle, CMA and reviewed by me for thoroughness and accuracy.   Follow Up Instructions:  I discussed the assessment and treatment plan with the  patient. The patient was provided an opportunity to ask questions and all were answered. The patient agreed with the plan and demonstrated an understanding of the instructions.   The patient was advised to call back or seek an in-person evaluation if the symptoms worsen or if the condition fails to improve as anticipated.  I provided 15 minutes of non-face-to-face time during this encounter.   , Marzella Schlein, MD, MPH [facility]

## 2019-04-08 ENCOUNTER — Other Ambulatory Visit: Payer: Self-pay | Admitting: Family Medicine

## 2019-04-08 MED ORDER — LEVOTHYROXINE SODIUM 137 MCG PO CAPS: 1.0000 | ORAL_CAPSULE | Freq: Every day | ORAL | 1 refills | Status: DC

## 2019-04-08 NOTE — Telephone Encounter (Signed)
[facility] faxed refill request for the following medications:  Levothyroxine Sodium 137 MCG CAPS   Please advise.

## 2019-05-12 ENCOUNTER — Other Ambulatory Visit: Payer: Self-pay

## 2019-05-12 NOTE — Telephone Encounter (Signed)
Patient called requesting refills to mail order pharmacy. She is also asking to request a 2 day delivery due to her running out in the next few days. Thanks!

## 2019-05-13 MED ORDER — LEVOTHYROXINE SODIUM 137 MCG PO CAPS: 1.0000 | ORAL_CAPSULE | Freq: Every day | ORAL | 1 refills | Status: DC

## 2019-05-14 ENCOUNTER — Telehealth: Payer: Self-pay | Admitting: Family Medicine

## 2019-05-14 MED ORDER — LEVOTHYROXINE SODIUM 137 MCG PO TABS: 137.0000 ug | ORAL_TABLET | Freq: Every day | ORAL | 1 refills | Status: DC

## 2019-05-14 NOTE — Telephone Encounter (Signed)
No problem. Please send tabs

## 2019-05-14 NOTE — Telephone Encounter (Signed)
pt called saying [facility] told her that Dr. B needs to call them and request the Levothyroxine 137 MCG  tablets instead of the capsules.  It is 500.00 for the capsules and 25.00 for the tablets. They told her we had to actually call them and change this request.  CB#  [phone]  Barth Kirks

## 2019-05-14 NOTE — Telephone Encounter (Signed)
Sent new RX for tablets to [facility]

## 2019-05-15 ENCOUNTER — Telehealth: Payer: Self-pay | Admitting: Family Medicine

## 2019-05-15 MED ORDER — LEVOTHYROXINE SODIUM 137 MCG PO TABS: 137.0000 ug | ORAL_TABLET | Freq: Every day | ORAL | 0 refills | Status: DC

## 2019-05-15 NOTE — Telephone Encounter (Signed)
1 month supply sent to [facility] pharmacy until patient receives RX from mail order pharmacy.

## 2019-05-15 NOTE — Telephone Encounter (Signed)
Pt needs 2 weeks worth the Levothyrozine tablets sent to [facility]  She will not get her mail order rx until may 30  thanks teri

## 2019-05-28 ENCOUNTER — Other Ambulatory Visit: Payer: Self-pay | Admitting: Family Medicine

## 2019-08-11 ENCOUNTER — Telehealth: Payer: Self-pay | Admitting: Family Medicine

## 2019-08-11 MED ORDER — SERTRALINE HCL 50 MG PO TABS: 50.0000 mg | ORAL_TABLET | Freq: Every day | ORAL | 0 refills | Status: DC

## 2019-08-11 NOTE — Telephone Encounter (Signed)
Pt needs refill on Sertraline 50 mg  [facility]  [place]

## 2019-09-29 ENCOUNTER — Other Ambulatory Visit: Payer: Self-pay | Admitting: Family Medicine

## 2019-11-03 ENCOUNTER — Other Ambulatory Visit: Payer: Self-pay | Admitting: Family Medicine

## 2019-11-22 IMAGING — MG DIGITAL SCREENING BILATERAL MAMMOGRAM WITH TOMO AND CAD
8 series · 8 of 24 positions shown · non-contrast
Comparison: Previous exam(s).

CLINICAL DATA: Screening.

EXAM:
DIGITAL SCREENING BILATERAL MAMMOGRAM WITH TOMO AND CAD

[L CC synth-2D]
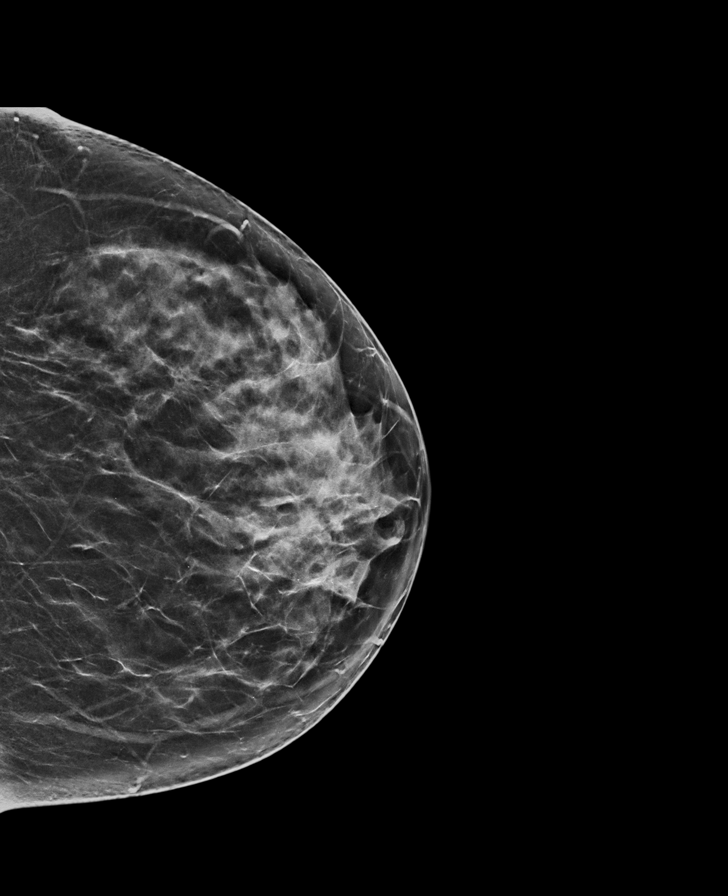

[R MLO synth-2D]
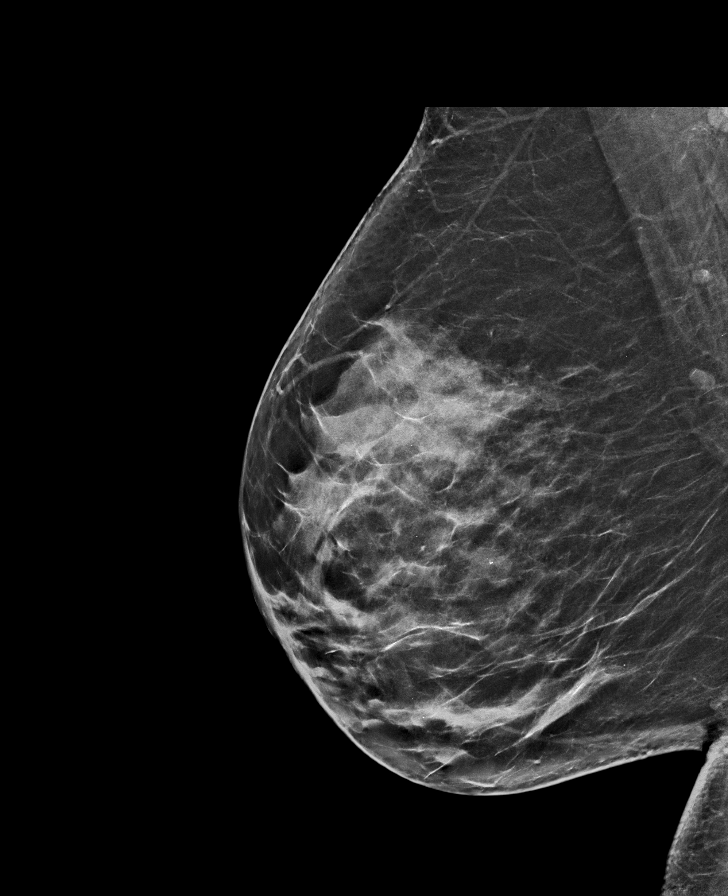

[R CC synth-2D]
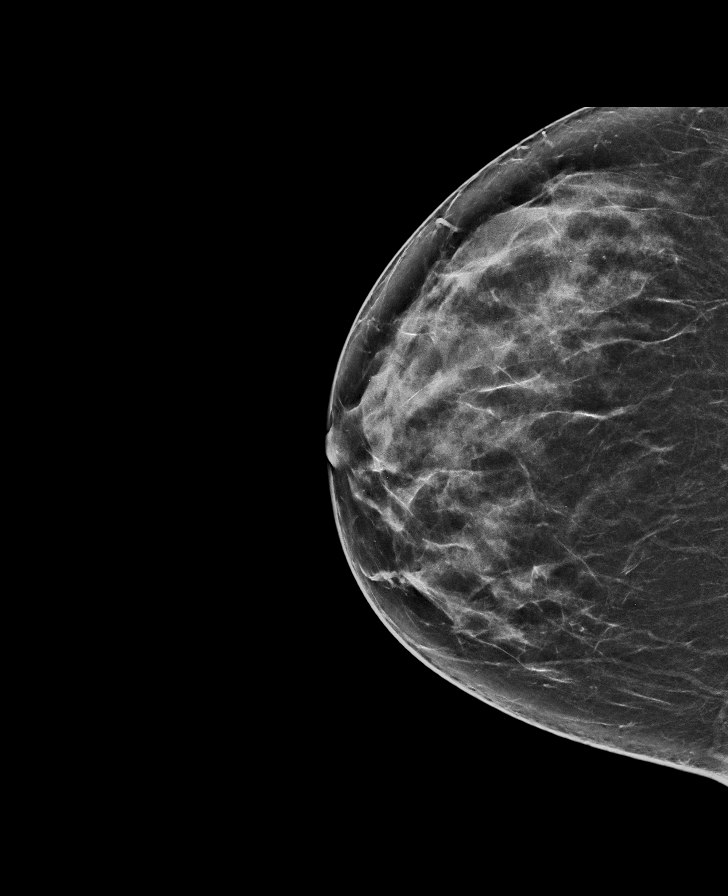

[L MLO synth-2D]
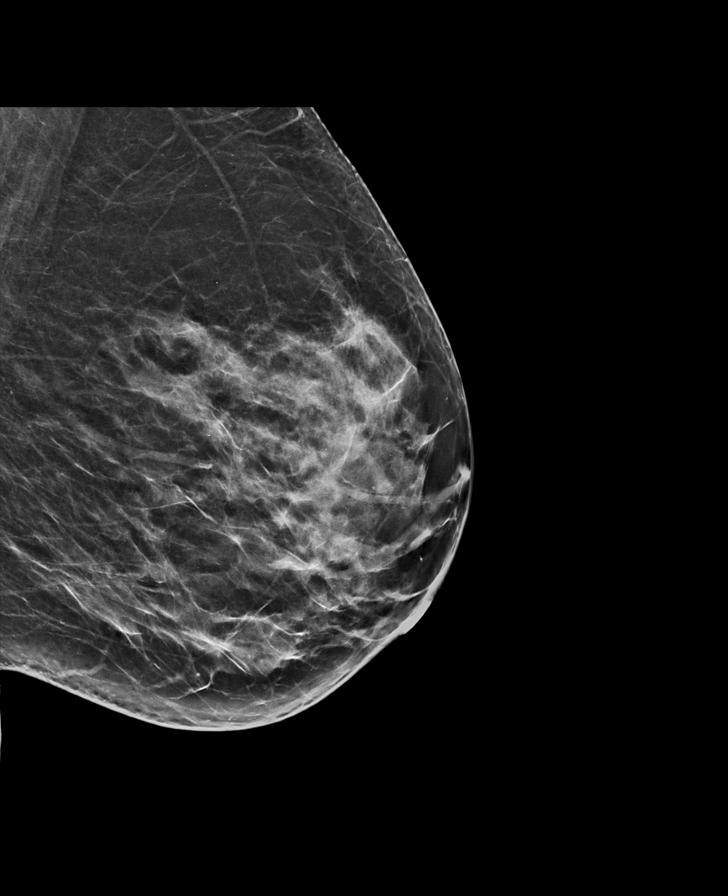

[R CC tomo · tomo slice 37/72.0]
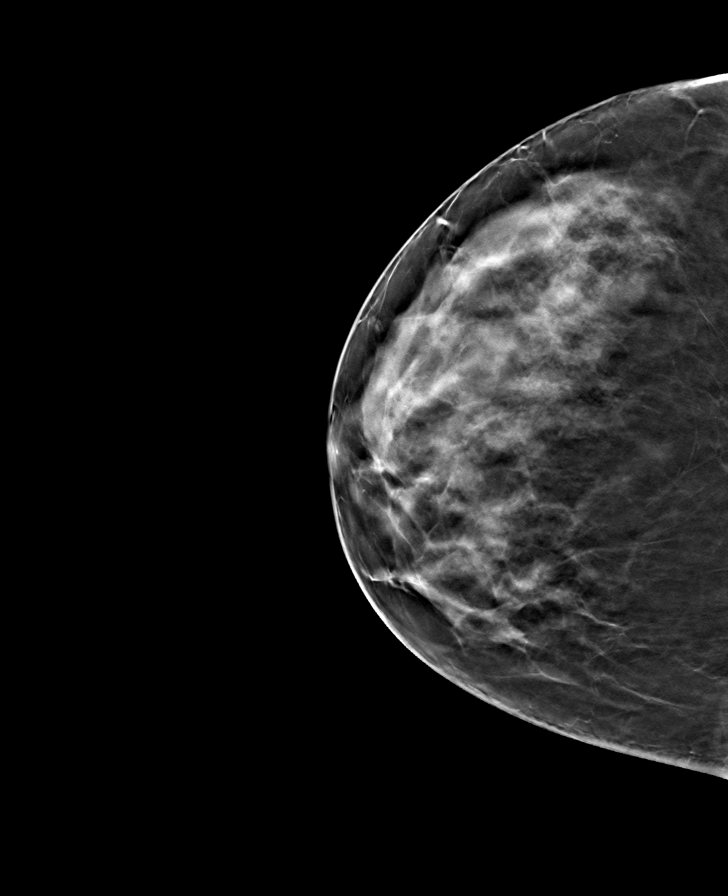

[L CC tomo · tomo slice 36/71.0]
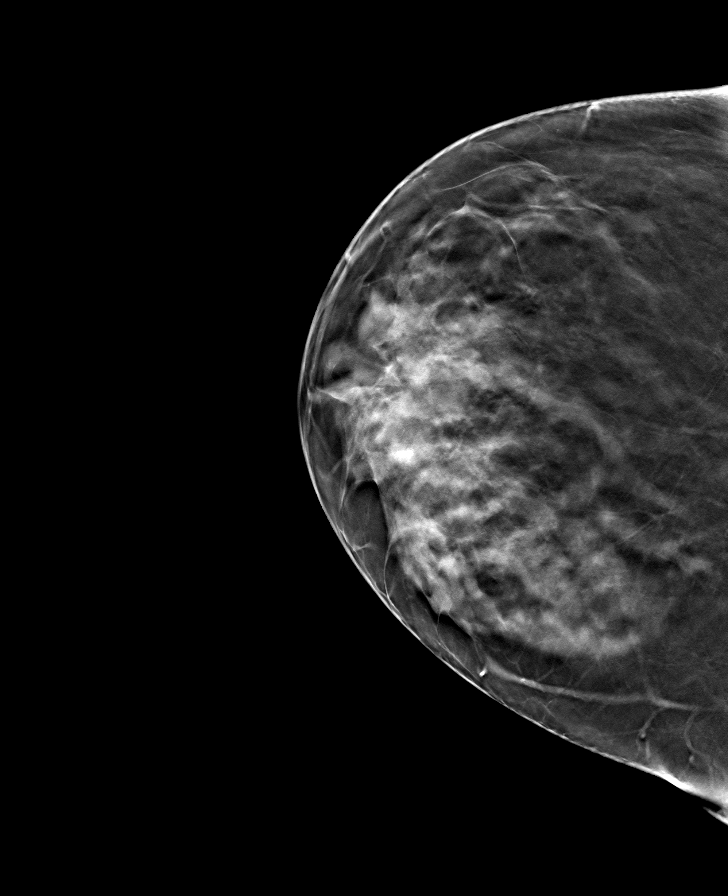

[L MLO tomo · tomo slice 35/69.0]
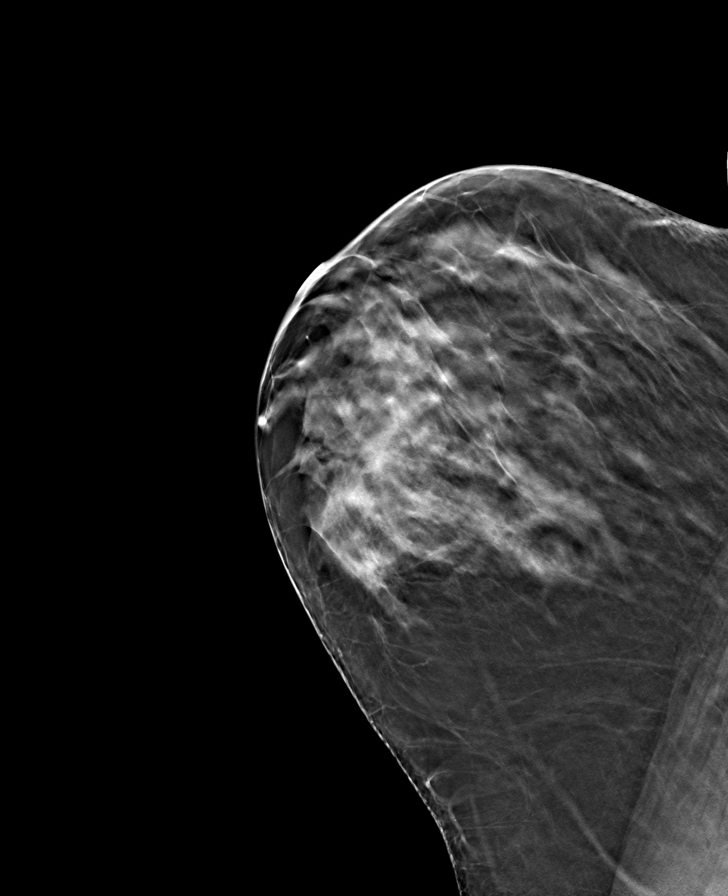

[R MLO tomo · tomo slice 37/72.0]
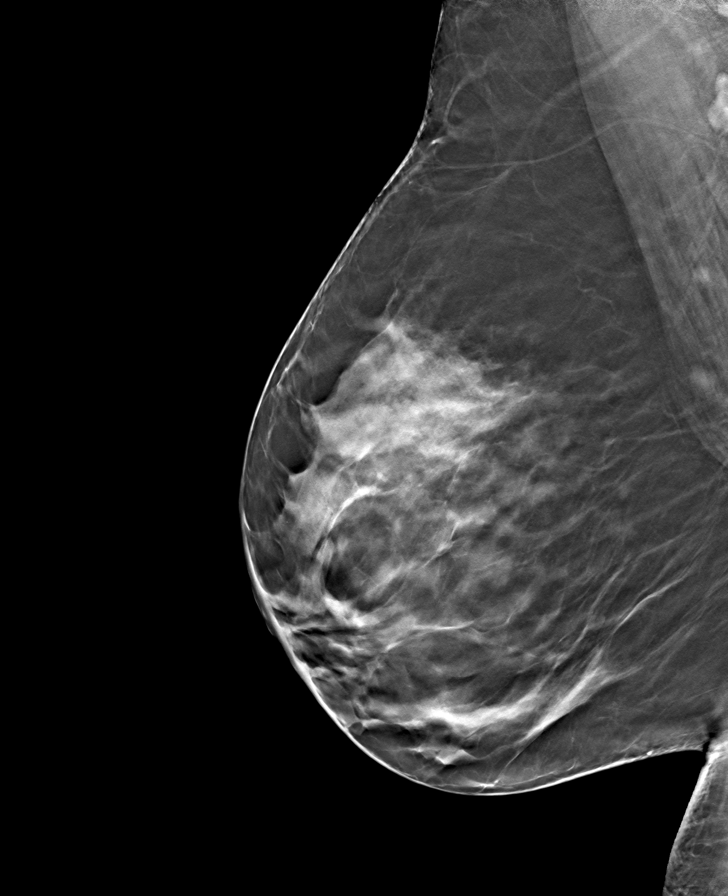

[8 of 24 positions shown; findings below may reference images not displayed]

ACR Breast Density Category c: The breast tissue is heterogeneously
dense, which may obscure small masses.
FINDINGS: There are no findings suspicious for malignancy. Images were
processed with CAD.
IMPRESSION: No mammographic evidence of malignancy. A result letter of this
screening mammogram will be mailed directly to the patient.

RECOMMENDATION:
Screening mammogram in one year. (Code:[id number])

BI-RADS CATEGORY  1: Negative.

## 2019-11-30 ENCOUNTER — Telehealth: Payer: Self-pay | Admitting: Family Medicine

## 2019-11-30 NOTE — Telephone Encounter (Signed)
Pt stated she is unable to sleep. Her mother is in the hospital with Covid. She (the pt) has been exposed. And she is trying keep her father calm as well as herself. She would like to know if Dr. B can send in a rx for Alprazolam as soon as possible just to get her through the week or so. Her mother's prognosis is not good she stated. Please advise.  Pt would like a call back if possible but would really like to request rx to her pharmacy.  [facility] [place], [place] [facility] [address] [phone] (Phone) [phone] (Fax)

## 2019-11-30 NOTE — Telephone Encounter (Signed)
From [facility] 

## 2019-12-01 ENCOUNTER — Ambulatory Visit (INDEPENDENT_AMBULATORY_CARE_PROVIDER_SITE_OTHER): Payer: BC Managed Care – PPO | Admitting: Family Medicine

## 2019-12-01 ENCOUNTER — Other Ambulatory Visit: Payer: Self-pay

## 2019-12-01 DIAGNOSIS — F430 Acute stress reaction: Secondary | ICD-10-CM

## 2019-12-01 DIAGNOSIS — F411 Generalized anxiety disorder: Secondary | ICD-10-CM

## 2019-12-01 MED ORDER — SERTRALINE HCL 100 MG PO TABS: 100.0000 mg | ORAL_TABLET | Freq: Every day | ORAL | 3 refills | Status: DC

## 2019-12-01 MED ORDER — METOPROLOL SUCCINATE ER 25 MG PO TB24: 25.0000 mg | ORAL_TABLET | Freq: Every day | ORAL | 3 refills | Status: DC

## 2019-12-01 MED ORDER — ALPRAZOLAM 0.5 MG PO TABS: 0.2500 mg | ORAL_TABLET | Freq: Every evening | ORAL | 0 refills | Status: DC | PRN

## 2019-12-01 NOTE — Progress Notes (Signed)
Patient: Laurie Peterson Female    DOB: September 28, 1966   53 y.o.   MRN: [id number] Visit Date: 12/01/2019  Today's Provider: Lavon Paganini, MD   Chief Complaint  Patient presents with  . Anxiety   Subjective:    Virtual Visit via Telephone Note  I connected with Laurie Peterson on 12/01/19 at 11:20 AM EST by telephone and verified that I am speaking with the correct person using two identifiers.  Location:  Patient location: home Provider location: [facility] Persons involved in the visit: patient, provider    I discussed the limitations, risks, security and privacy concerns of performing an evaluation and management service by telephone and the availability of in person appointments. I also discussed with the patient that there may be a patient responsible charge related to this service. The patient expressed understanding and agreed to proceed.   Anxiety Presents for follow-up visit. Symptoms include excessive worry, nervous/anxious behavior and panic. Patient reports no confusion, decreased concentration, depressed mood, dizziness, insomnia or suicidal ideas. The quality of sleep is fair.    Her anxiety was well controlled prior to her mother's hospitalization for COVID with poor prognosis. She is now having significant anxiety, worry, difficulty sleeping, and increased somatic symptoms including diarrhea and stomach cramping as well as tachycardia.  She was weaning sertraline prior to this, but feels like she needs it back on board.  Has taken Metoprolol previously for tachycardia in the setting of anxiety.  She found a bottle of Xanax from 7 years ago and has taken it to help with sleep recently.  She was also exposed to her mother with [place] on 11/3.    No Known Allergies   Current Outpatient Medications:  .  levothyroxine (SYNTHROID) 137 MCG tablet, TAKE 1 TABLET BY MOUTH  DAILY BEFORE BREAKFAST, Disp: 90 tablet, Rfl: 0 .  sertraline (ZOLOFT) 50 MG tablet,  TAKE 1 TABLET BY MOUTH  DAILY, Disp: 90 tablet, Rfl: 1  Review of Systems  Constitutional: Negative.   Respiratory: Negative.   Neurological: Negative for dizziness, light-headedness and headaches.  Psychiatric/Behavioral: Negative for agitation, behavioral problems, confusion, decreased concentration, dysphoric mood, hallucinations, self-injury, sleep disturbance and suicidal ideas. The patient is nervous/anxious. The patient does not have insomnia and is not hyperactive.     Social History   Tobacco Use  . Smoking status: Never Smoker  . Smokeless tobacco: Never Used  Substance Use Topics  . Alcohol use: No    Alcohol/week: 0.0 standard drinks      Objective:   There were no vitals taken for this visit. There were no vitals filed for this visit.There is no height or weight on file to calculate BMI.   Physical Exam Speaking in full sentences, NAD +Anxious mood  No results found for any visits on 12/01/19.     Assessment & Plan    Follow Up Instructions:  I discussed the assessment and treatment plan with the patient. The patient was provided an opportunity to ask questions and all were answered. The patient agreed with the plan and demonstrated an understanding of the instructions.   The patient was advised to call back or seek an in-person evaluation if the symptoms worsen or if the condition fails to improve as anticipated.  I provided 25 minutes of non-face-to-face time during this encounter.   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   The entirety of the information documented in the History of Present Illness, Review of Systems and Physical Exam were personally obtained by me. Portions of this information were initially documented by Kavin Leech, CMA and reviewed by me for thoroughness and accuracy.    Avalee Castrellon, Marzella Schlein, MD MPH [facility]

## 2019-12-01 NOTE — Assessment & Plan Note (Addendum)
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

## 2019-12-01 NOTE — Telephone Encounter (Signed)
Pt calling back this am to ask for appt.  Pt states at this time, she will see anyone.  Pt states she is having "full blown anxiety". Pt states she knows what she needs, and needs appt to discuss. Advised pt we are waiting for dr to answer note from yesterday.

## 2019-12-01 NOTE — Telephone Encounter (Signed)
I think that a virtual visit would be appropriate for this.  Looks like there are openings in other providers schedules today.  If she would prefer, I can also double book my 1120 and talk to her then.  It does sound like she would be appropriate for alprazolam, but before prescribing controlled substances, we do like to check in with them and actually see them.

## 2019-12-01 NOTE — Telephone Encounter (Signed)
Apt made for 12/01/2019 at 11:20  Thanks,   -Mickel Baas

## 2019-12-03 ENCOUNTER — Other Ambulatory Visit: Payer: Self-pay

## 2019-12-03 DIAGNOSIS — Z20822 Contact with and (suspected) exposure to covid-19: Secondary | ICD-10-CM

## 2019-12-05 LAB — NOVEL CORONAVIRUS, NAA: SARS-CoV-2, NAA: NOT DETECTED

## 2019-12-29 ENCOUNTER — Other Ambulatory Visit: Payer: Self-pay | Admitting: Family Medicine

## 2019-12-29 NOTE — Telephone Encounter (Signed)
Pt requesting a year's supply of levothyroxine 137 mcg.

## 2019-12-29 NOTE — Telephone Encounter (Signed)
Copied from CRM [id number]. Topic: Quick Communication - Rx Refill/Question >> Dec 29, 2019  3:58 PM Marylen Ponto wrote: Medication: levothyroxine (SYNTHROID) 137 MCG tablet  Has the patient contacted their pharmacy? yes   Preferred Pharmacy (with phone number or street name): [facility] - [place], [place] - [address] Phone: [phone]   Fax: [phone]  Agent: Please be advised that RX refills may take up to 3 business days. We ask that you follow-up with your pharmacy.

## 2019-12-30 MED ORDER — LEVOTHYROXINE SODIUM 137 MCG PO TABS: ORAL_TABLET | ORAL | 0 refills | Status: DC

## 2019-12-30 NOTE — Telephone Encounter (Signed)
LMTCB to schedule appointment for follow up.

## 2020-01-01 ENCOUNTER — Ambulatory Visit: Payer: BC Managed Care – PPO | Admitting: Family Medicine

## 2020-01-06 ENCOUNTER — Other Ambulatory Visit: Payer: Self-pay

## 2020-01-06 ENCOUNTER — Ambulatory Visit (INDEPENDENT_AMBULATORY_CARE_PROVIDER_SITE_OTHER): Payer: BC Managed Care – PPO | Admitting: Family Medicine

## 2020-01-06 ENCOUNTER — Encounter: Payer: Self-pay | Admitting: Family Medicine

## 2020-01-06 VITALS — BP 89/65 | HR 79 | Temp 97.1°F | Resp 16 | Ht 66.0 in | Wt 153.0 lb

## 2020-01-06 DIAGNOSIS — I959 Hypotension, unspecified: Secondary | ICD-10-CM | POA: Diagnosis not present

## 2020-01-06 DIAGNOSIS — F411 Generalized anxiety disorder: Secondary | ICD-10-CM

## 2020-01-06 DIAGNOSIS — E039 Hypothyroidism, unspecified: Secondary | ICD-10-CM | POA: Diagnosis not present

## 2020-01-06 DIAGNOSIS — Z23 Encounter for immunization: Secondary | ICD-10-CM

## 2020-01-06 NOTE — Patient Instructions (Signed)

## 2020-01-06 NOTE — Assessment & Plan Note (Signed)
Previously well controlled Continue Synthroid at current dose  Recheck TSH and adjust Synthroid as indicated   

## 2020-01-06 NOTE — Assessment & Plan Note (Signed)
Chronic, currently well controlled Continue Zoloft 100mg  daily Have discussed synergistic effects of therapy previously  At next visit, if remains well controlled could consider tapering again Tachycardia has resolved - d/c metoprolol Has not needed Xanax F/u in 6 m and repeat PHQ/GAD7

## 2020-01-06 NOTE — Progress Notes (Signed)
Patient: Laurie Peterson Female    DOB: 05-14-1966   54 y.o.   MRN: [id number] Visit Date: 01/06/2020  Today's Provider: Shirlee Latch, MD   Chief Complaint  Patient presents with  . Anxiety   Subjective:    I [place] S. Dimas, CMA, am acting as scribe for Shirlee Latch, MD.  HPI   Follow up for Anxiety  The patient was last seen for this 1 months ago. Changes made at last visit include resume Zoloft 100 mg daily. Trial of Metoprolol for tachycardia, and Xanax to [place] as needed only.  She reports excellent compliance with treatment. Patient reports that Zoloft is working well for her. Patient reports that she only took Metoprolol for a week.  She feels that condition is Improved. She is not having side effects.   Her mother made it out of the hospital for COVID19 and then she was at [facility] rehab for 3 wks.   GAD 7 : Generalized Anxiety Score 01/06/2020 03/30/2019  Nervous, Anxious, on Edge 0 1  Control/stop worrying 0 0  Worry too much - different things 1 0  Trouble relaxing 1 0  Restless 1 1  Easily annoyed or irritable 0 0  Afraid - awful might happen 0 1  Total GAD 7 Score 3 3  Anxiety Difficulty Not difficult at all Not difficult at all   ------------------------------------------------------------------------------------ Hypothyroidism: Taking synthroid with good compliance, asymptoamtic.  No Known Allergies   Current Outpatient Medications:  .  ALPRAZolam (XANAX) 0.5 MG tablet, Take 0.5-1 tablets (0.25-0.5 mg total) by mouth at bedtime as needed for anxiety., Disp: 30 tablet, Rfl: 0 .  levothyroxine (SYNTHROID) 137 MCG tablet, TAKE 1 TABLET BY MOUTH  DAILY BEFORE BREAKFAST, Disp: 90 tablet, Rfl: 0 .  sertraline (ZOLOFT) 100 MG tablet, Take 1 tablet (100 mg total) by mouth daily., Disp: 30 tablet, Rfl: 3  Review of Systems  Constitutional: Negative for appetite change, chills, fatigue and fever.  Respiratory: Negative for chest tightness and shortness of  breath.   Cardiovascular: Negative for chest pain and palpitations.  Gastrointestinal: Negative for abdominal pain, nausea and vomiting.  Neurological: Negative for dizziness and weakness.  Psychiatric/Behavioral: Negative for agitation, decreased concentration and sleep disturbance. The patient is not nervous/anxious.     Social History   Tobacco Use  . Smoking status: Never Smoker  . Smokeless tobacco: Never Used  Substance Use Topics  . Alcohol use: No    Alcohol/week: 0.0 standard drinks      Objective:   BP (!) 89/65 (BP Location: Left Arm, Patient Position: Sitting, Cuff Size: Normal)   Pulse 79   Temp (!) 97.1 F (36.2 C) (Temporal)   Resp 16   Ht 5\' 6"  (1.676 m)   Wt 153 lb (69.4 kg)   BMI 24.69 kg/m  Vitals:   01/06/20 1337  BP: (!) 89/65  Pulse: 79  Resp: 16  Temp: (!) 97.1 F (36.2 C)  TempSrc: Temporal  Weight: 153 lb (69.4 kg)  Height: 5\' 6"  (1.676 m)  Body mass index is 24.69 kg/m.   Physical Exam Vitals reviewed.  Constitutional:      General: She is not in acute distress.    Appearance: She is well-developed.  HENT:     Head: Normocephalic and atraumatic.  Eyes:     General: No scleral icterus.    Conjunctiva/sclera: Conjunctivae normal.  Cardiovascular:     Rate and Rhythm: Normal rate and regular rhythm.  Pulmonary:  Effort: Pulmonary effort is normal. No respiratory distress.  Musculoskeletal:     Cervical back: Neck supple.     Right lower leg: No edema.     Left lower leg: No edema.  Lymphadenopathy:     Cervical: No cervical adenopathy.  Skin:    General: Skin is warm and dry.     Capillary Refill: Capillary refill takes less than 2 seconds.     Findings: No rash.  Neurological:     Mental Status: She is alert and oriented to person, place, and time.  Psychiatric:        Mood and Affect: Mood and affect normal.        Speech: Speech normal.        Behavior: Behavior normal.        Thought Content: Thought content does  not include homicidal or suicidal ideation.      No results found for any visits on 01/06/20.     Assessment & Plan    Problem List Items Addressed This Visit      Cardiovascular and Mediastinum   Hypotension    Noted incidentally during her visit today Asymptomatic Likely related to relative dehydration Advised on improving p.o. intake        Endocrine   Adult hypothyroidism    Previously well controlled Continue Synthroid at current dose  Recheck TSH and adjust Synthroid as indicated        Relevant Orders   TSH     Other   GAD (generalized anxiety disorder) - Primary    Chronic, currently well controlled Continue Zoloft 100mg  daily Have discussed synergistic effects of therapy previously  At next visit, if remains well controlled could consider tapering again Tachycardia has resolved - d/c metoprolol Has not needed Xanax F/u in 6 m and repeat PHQ/GAD7       Other Visit Diagnoses    Need for Tdap vaccination       Relevant Orders   Tdap vaccine greater than or equal to 7yo IM (Completed)       Return in about 6 months (around 07/05/2020) for CPE.  Total time spent on today's visit was greater than 30 minutes, including both face-to-face time and nonface-to-face time personally spent on review of chart (labs and imaging), discussing labs and goals, discussing further work-up, treatment options, referrals to specialist if needed, reviewing outside records of pertinent, answering patient's questions, and coordinating care.  The entirety of the information documented in the History of Present Illness, Review of Systems and Physical Exam were personally obtained by me. Portions of this information were initially documented by Lynford Humphrey, CMA and reviewed by me for thoroughness and accuracy.    Brance Dartt, Dionne Bucy, MD MPH [facility]

## 2020-01-06 NOTE — Assessment & Plan Note (Signed)
Noted incidentally during her visit today Asymptomatic Likely related to relative dehydration Advised on improving p.o. intake

## 2020-01-07 ENCOUNTER — Ambulatory Visit: Payer: Self-pay | Admitting: Family Medicine

## 2020-01-07 LAB — TSH: TSH: 4.07 u[IU]/mL (ref 0.450–4.500)

## 2020-03-01 ENCOUNTER — Other Ambulatory Visit: Payer: Self-pay | Admitting: Family Medicine

## 2020-03-01 NOTE — Telephone Encounter (Signed)
Requested Prescriptions  Pending Prescriptions Disp Refills  . levothyroxine (SYNTHROID) 137 MCG tablet [Pharmacy Med Name: LEVOTHYROXINE   TAB] 90 tablet 3    Sig: TAKE 1 TABLET BY MOUTH  DAILY BEFORE BREAKFAST     Endocrinology:  Hypothyroid Agents Failed - 03/01/2020  6:01 AM      Failed - TSH needs to be rechecked within 3 months after an abnormal result. Refill until TSH is due.      Passed - TSH in normal range and within 360 days    TSH  Date Value Ref Range Status  01/06/2020 4.070 0.450 - 4.500 uIU/mL Final         Passed - Valid encounter within last 12 months    Recent Outpatient Visits          1 month ago GAD (generalized anxiety disorder)   [facility] [place], Marzella Schlein, MD   3 months ago GAD (generalized anxiety disorder)   [facility], Marzella Schlein, MD   11 months ago GAD (generalized anxiety disorder)   [facility], Marzella Schlein, MD   1 year ago Bacterial conjunctivitis of both eyes   [facility] [place], Marzella Schlein, MD   1 year ago Bronchitis   [facility] [place], [place]

## 2020-07-18 ENCOUNTER — Encounter: Payer: Self-pay | Admitting: Family Medicine

## 2020-10-14 ENCOUNTER — Other Ambulatory Visit: Payer: Self-pay | Admitting: Family Medicine

## 2020-10-14 MED ORDER — SERTRALINE HCL 100 MG PO TABS: 100.0000 mg | ORAL_TABLET | Freq: Every day | ORAL | 2 refills | Status: DC

## 2020-10-14 NOTE — Telephone Encounter (Signed)
Patient requesting a refill  for the following medication:  sertraline (ZOLOFT) 100 MG tablet  Please advise. Thanks, [employer]

## 2020-10-16 ENCOUNTER — Other Ambulatory Visit: Payer: Self-pay | Admitting: Family Medicine

## 2020-11-05 ENCOUNTER — Other Ambulatory Visit: Payer: BLUE CROSS/BLUE SHIELD

## 2020-11-25 ENCOUNTER — Encounter: Payer: BC Managed Care – PPO | Admitting: Family Medicine

## 2020-11-29 ENCOUNTER — Encounter: Payer: Self-pay | Admitting: Family Medicine

## 2020-11-29 ENCOUNTER — Ambulatory Visit (INDEPENDENT_AMBULATORY_CARE_PROVIDER_SITE_OTHER): Payer: BC Managed Care – PPO | Admitting: Family Medicine

## 2020-11-29 ENCOUNTER — Other Ambulatory Visit: Payer: Self-pay

## 2020-11-29 VITALS — BP 114/76 | HR 88 | Temp 98.3°F | Resp 16 | Wt 158.0 lb

## 2020-11-29 DIAGNOSIS — F411 Generalized anxiety disorder: Secondary | ICD-10-CM

## 2020-11-29 DIAGNOSIS — E039 Hypothyroidism, unspecified: Secondary | ICD-10-CM | POA: Diagnosis not present

## 2020-11-29 DIAGNOSIS — Z1159 Encounter for screening for other viral diseases: Secondary | ICD-10-CM | POA: Diagnosis not present

## 2020-11-29 DIAGNOSIS — Z23 Encounter for immunization: Secondary | ICD-10-CM

## 2020-11-29 DIAGNOSIS — Z0000 Encounter for general adult medical examination without abnormal findings: Secondary | ICD-10-CM

## 2020-11-29 DIAGNOSIS — Z114 Encounter for screening for human immunodeficiency virus [HIV]: Secondary | ICD-10-CM | POA: Diagnosis not present

## 2020-11-29 DIAGNOSIS — Z1231 Encounter for screening mammogram for malignant neoplasm of breast: Secondary | ICD-10-CM

## 2020-11-29 MED ORDER — SERTRALINE HCL 50 MG PO TABS
50.0000 mg | ORAL_TABLET | Freq: Every day | ORAL | 3 refills | Status: DC
Start: 2020-11-29 — End: 2021-12-01

## 2020-11-29 NOTE — Assessment & Plan Note (Signed)
Previously well controlled Continue Synthroid at current dose  Recheck TSH and adjust Synthroid as indicated   

## 2020-11-29 NOTE — Assessment & Plan Note (Signed)
Chronic and well controlled  continue zoloft 50mg  daily Taking xanax very infrequently

## 2020-11-29 NOTE — Patient Instructions (Addendum)
The CDC recommends two doses of Shingrix (the shingles vaccine) separated by 2 to 6 months for adults age 54 years and older. I recommend checking with your insurance plan regarding coverage for this vaccine.     You are eligible for COVID booster   Preventive Care 54-44 Years Old, Female Preventive care refers to visits with your health care provider and lifestyle choices that can promote health and wellness. This includes:  A yearly physical exam. This may also be called an annual well check.  Regular dental visits and eye exams.  Immunizations.  Screening for certain conditions.  Healthy lifestyle choices, such as eating a healthy diet, getting regular exercise, not using drugs or products that contain nicotine and tobacco, and limiting alcohol use. What can I expect for my preventive care visit? Physical exam Your health care provider will check your:  Height and weight. This may be used to calculate body mass index (BMI), which tells if you are at a healthy weight.  Heart rate and blood pressure.  Skin for abnormal spots. Counseling Your health care provider may ask you questions about your:  Alcohol, tobacco, and drug use.  Emotional well-being.  Home and relationship well-being.  Sexual activity.  Eating habits.  Work and work Statistician.  Method of birth control.  Menstrual cycle.  Pregnancy history. What immunizations do I need?  Influenza (flu) vaccine  This is recommended every year. Tetanus, diphtheria, and pertussis (Tdap) vaccine  You may need a Td booster every 10 years. Varicella (chickenpox) vaccine  You may need this if you have not been vaccinated. Zoster (shingles) vaccine  You may need this after age 24. Measles, mumps, and rubella (MMR) vaccine  You may need at least one dose of MMR if you were born in 1957 or later. You may also need a second dose. Pneumococcal conjugate (PCV13) vaccine  You may need this if you have certain  conditions and were not previously vaccinated. Pneumococcal polysaccharide (PPSV23) vaccine  You may need one or two doses if you smoke cigarettes or if you have certain conditions. Meningococcal conjugate (MenACWY) vaccine  You may need this if you have certain conditions. Hepatitis A vaccine  You may need this if you have certain conditions or if you travel or work in places where you may be exposed to hepatitis A. Hepatitis B vaccine  You may need this if you have certain conditions or if you travel or work in places where you may be exposed to hepatitis B. Haemophilus influenzae type b (Hib) vaccine  You may need this if you have certain conditions. Human papillomavirus (HPV) vaccine  If recommended by your health care provider, you may need three doses over 6 months. You may receive vaccines as individual doses or as more than one vaccine together in one shot (combination vaccines). Talk with your health care provider about the risks and benefits of combination vaccines. What tests do I need? Blood tests  Lipid and cholesterol levels. These may be checked every 5 years, or more frequently if you are over 8 years old.  Hepatitis C test.  Hepatitis B test. Screening  Lung cancer screening. You may have this screening every year starting at age 16 if you have a 30-pack-year history of smoking and currently smoke or have quit within the past 15 years.  Colorectal cancer screening. All adults should have this screening starting at age 21 and continuing until age 3. Your health care provider may recommend screening at age 54  if you are at increased risk. You will have tests every 1-10 years, depending on your results and the type of screening test.  Diabetes screening. This is done by checking your blood sugar (glucose) after you have not eaten for a while (fasting). You may have this done every 1-3 years.  Mammogram. This may be done every 1-2 years. Talk with your health care  provider about when you should start having regular mammograms. This may depend on whether you have a family history of breast cancer.  BRCA-related cancer screening. This may be done if you have a family history of breast, ovarian, tubal, or peritoneal cancers.  Pelvic exam and Pap test. This may be done every 3 years starting at age 21. Starting at age 30, this may be done every 5 years if you have a Pap test in combination with an HPV test. Other tests  Sexually transmitted disease (STD) testing.  Bone density scan. This is done to screen for osteoporosis. You may have this scan if you are at high risk for osteoporosis. Follow these instructions at home: Eating and drinking  Eat a diet that includes fresh fruits and vegetables, whole grains, lean protein, and low-fat dairy.  Take vitamin and mineral supplements as recommended by your health care provider.  Do not drink alcohol if: ? Your health care provider tells you not to drink. ? You are pregnant, may be pregnant, or are planning to become pregnant.  If you drink alcohol: ? Limit how much you have to 0-1 drink a day. ? Be aware of how much alcohol is in your drink. In the [place] one drink equals one 12 oz bottle of beer (355 mL), one 5 oz glass of wine (148 mL), or one 1 oz glass of hard liquor (44 mL). Lifestyle  Take daily care of your teeth and gums.  Stay active. Exercise for at least 30 minutes on 5 or more days each week.  Do not use any products that contain nicotine or tobacco, such as cigarettes, e-cigarettes, and chewing tobacco. If you need help quitting, ask your health care provider.  If you are sexually active, practice safe sex. Use a condom or other form of birth control (contraception) in order to prevent pregnancy and STIs (sexually transmitted infections).  If told by your health care provider, take low-dose aspirin daily starting at age 50. What's next?  Visit your health care provider once a year for a  well check visit.  Ask your health care provider how often you should have your eyes and teeth checked.  Stay up to date on all vaccines. This information is not intended to replace advice given to you by your health care provider. Make sure you discuss any questions you have with your health care provider. Document Revised: 08/21/2018 Document Reviewed: 08/21/2018 Elsevier Patient Education  2020 Elsevier Inc.  

## 2020-11-29 NOTE — Progress Notes (Signed)
Complete physical exam   Patient: Laurie Peterson   DOB: 05-08-1966   54 y.o. Female  MRN: [id number] Visit Date: 11/29/2020  Today's healthcare provider: Shirlee Latch, MD   Chief Complaint  Patient presents with  . Annual Exam   Subjective    Laurie Peterson is a 54 y.o. female who presents today for a complete physical exam.  She reports consuming a general diet. Home exercise routine includes hiking . She generally feels well. She reports sleeping poorly. She does not have additional problems to discuss today.  HPI  11/26/2017 Pap/HPV-neg 01/27/2019 Mammogram-BI-RADS 1 02/14/2018 Colonoscopy    History reviewed. No pertinent past medical history. History reviewed. No pertinent surgical history. Social History   Socioeconomic History  . Marital status: Married    Spouse name: Not on file  . Number of children: Not on file  . Years of education: Not on file  . Highest education level: Not on file  Occupational History  . Not on file  Tobacco Use  . Smoking status: Never Smoker  . Smokeless tobacco: Never Used  Substance and Sexual Activity  . Alcohol use: No    Alcohol/week: 0.0 standard drinks  . Drug use: Not on file  . Sexual activity: Not on file  Other Topics Concern  . Not on file  Social History Narrative  . Not on file   Social Determinants of Health   Financial Resource Strain:   . Difficulty of Paying Living Expenses: Not on file  Food Insecurity:   . Worried About Programme researcher, broadcasting/film/video in the Last Year: Not on file  . Ran Out of Food in the Last Year: Not on file  Transportation Needs:   . Lack of Transportation (Medical): Not on file  . Lack of Transportation (Non-Medical): Not on file  Physical Activity:   . Days of Exercise per Week: Not on file  . Minutes of Exercise per Session: Not on file  Stress:   . Feeling of Stress : Not on file  Social Connections:   . Frequency of Communication with Friends and Family: Not on file  . Frequency  of Social Gatherings with Friends and Family: Not on file  . Attends Religious Services: Not on file  . Active Member of Clubs or Organizations: Not on file  . Attends Banker Meetings: Not on file  . Marital Status: Not on file  Intimate Partner Violence:   . Fear of Current or Ex-Partner: Not on file  . Emotionally Abused: Not on file  . Physically Abused: Not on file  . Sexually Abused: Not on file   Family Status  Relation Name Status  . Neg Hx  (Not Specified)   Family History  Problem Relation Age of Onset  . Breast cancer Neg Hx    No Known Allergies  Patient Care Team: Erasmo Downer, MD as PCP - General (Family Medicine)   Medications: Outpatient Medications Prior to Visit  Medication Sig  . ALPRAZolam (XANAX) 0.5 MG tablet Take 0.5-1 tablets (0.25-0.5 mg total) by mouth at bedtime as needed for anxiety.  Marland Kitchen levothyroxine (SYNTHROID) 137 MCG tablet TAKE 1 TABLET BY MOUTH  DAILY BEFORE BREAKFAST  . [DISCONTINUED] sertraline (ZOLOFT) 50 MG tablet TAKE 1 TABLET BY MOUTH  DAILY  . [DISCONTINUED] sertraline (ZOLOFT) 100 MG tablet Take 1 tablet (100 mg total) by mouth daily. (Patient not taking: Reported on 11/29/2020)   No facility-administered medications prior to visit.  Review of Systems  Constitutional: Negative.   HENT: Positive for rhinorrhea and sinus pressure.   Eyes: Negative.   Respiratory: Negative.   Cardiovascular: Negative.   Gastrointestinal: Negative.   Endocrine: Negative.   Genitourinary: Negative.   Musculoskeletal: Negative.   Skin: Negative.   Allergic/Immunologic: Negative.   Neurological: Negative.   Hematological: Negative.   Psychiatric/Behavioral: Negative.     Last CBC Lab Results  Component Value Date   WBC 6.4 01/07/2019   HGB 13.7 01/07/2019   HCT 42 01/07/2019   PLT 267 01/07/2019   Last metabolic panel Lab Results  Component Value Date   NA 139 01/07/2019   K 4.0 01/07/2019   BUN 15 01/07/2019    CREATININE 0.7 01/07/2019   ALKPHOS 50 01/07/2019   AST 11 (A) 01/07/2019   ALT 10 01/07/2019   Last lipids Lab Results  Component Value Date   CHOL 195 01/07/2019   HDL 32 (A) 01/07/2019   LDLCALC 122 01/07/2019   TRIG 206 (A) 01/07/2019   Last hemoglobin A1c No results found for: HGBA1C Last thyroid functions Lab Results  Component Value Date   TSH 4.070 01/06/2020   Last vitamin D No results found for: 25OHVITD2, 25OHVITD3, VD25OH Last vitamin B12 and Folate Lab Results  Component Value Date   VITAMINB12 316 01/07/2019      Objective    BP 114/76 (BP Location: Left Arm, Patient Position: Sitting, Cuff Size: Normal)   Pulse 88   Temp 98.3 F (36.8 C) (Oral)   Resp 16   Wt 158 lb (71.7 kg)   SpO2 98%   BMI 25.50 kg/m  BP Readings from Last 3 Encounters:  11/29/20 114/76  01/06/20 (!) 89/65  02/20/19 116/71   Wt Readings from Last 3 Encounters:  11/29/20 158 lb (71.7 kg)  01/06/20 153 lb (69.4 kg)  02/20/19 191 lb (86.6 kg)      Physical Exam Vitals reviewed.  Constitutional:      General: She is not in acute distress.    Appearance: Normal appearance. She is well-developed. She is not diaphoretic.  HENT:     Head: Normocephalic and atraumatic.     Right Ear: Tympanic membrane, ear canal and external ear normal.     Left Ear: Tympanic membrane, ear canal and external ear normal.  Eyes:     General: No scleral icterus.    Conjunctiva/sclera: Conjunctivae normal.     Pupils: Pupils are equal, round, and reactive to light.  Neck:     Thyroid: No thyromegaly.  Cardiovascular:     Rate and Rhythm: Normal rate and regular rhythm.     Pulses: Normal pulses.     Heart sounds: Normal heart sounds. No murmur heard.   Pulmonary:     Effort: Pulmonary effort is normal. No respiratory distress.     Breath sounds: Normal breath sounds. No wheezing or rales.  Chest:     Comments: Breasts: breasts appear normal, no suspicious masses, no skin or nipple  changes or axillary nodes.  Abdominal:     General: There is no distension.     Palpations: Abdomen is soft.     Tenderness: There is no abdominal tenderness.  Musculoskeletal:        General: No deformity.     Cervical back: Neck supple.     Right lower leg: No edema.     Left lower leg: No edema.  Lymphadenopathy:     Cervical: No cervical adenopathy.  Skin:  General: Skin is warm and dry.     Findings: No rash.  Neurological:     Mental Status: She is alert and oriented to person, place, and time. Mental status is at baseline.     Sensory: No sensory deficit.     Motor: No weakness.     Gait: Gait normal.  Psychiatric:        Mood and Affect: Mood normal.        Behavior: Behavior normal.        Thought Content: Thought content normal.      Last depression screening scores PHQ 2/9 Scores 11/29/2020 03/30/2019  PHQ - 2 Score 0 0  PHQ- 9 Score 0 0   Last fall risk screening Fall Risk  11/29/2020  Falls in the past year? 0  Number falls in past yr: 0  Injury with Fall? 0  Risk for fall due to : No Fall Risks  Follow up Falls evaluation completed   Last Audit-C alcohol use screening Alcohol Use Disorder Test (AUDIT) 11/29/2020  1. How often do you have a drink containing alcohol? 0  2. How many drinks containing alcohol do you have on a typical day when you are drinking? 0  3. How often do you have six or more drinks on one occasion? 0  AUDIT-C Score 0  Alcohol Brief Interventions/Follow-up AUDIT Score <7 follow-up not indicated   A score of 3 or more in women, and 4 or more in men indicates increased risk for alcohol abuse, EXCEPT if all of the points are from question 1   No results found for any visits on 11/29/20.  Assessment & Plan    Routine Health Maintenance and Physical Exam  Exercise Activities and Dietary recommendations Goals   None     Immunization History  Administered Date(s) Administered  . Influenza Nasal 10/16/2008  . Influenza,inj,Quad  PF,6+ Mos 10/15/2019  . Moderna SARS-COVID-2 Vaccination 02/27/2020, 04/02/2020  . Tdap 01/06/2020    Health Maintenance  Topic Date Due  . Hepatitis C Screening  Never done  . INFLUENZA VACCINE  07/24/2020  . MAMMOGRAM  01/27/2021  . PAP SMEAR-Modifier  11/26/2022  . COLONOSCOPY  02/15/2028  . TETANUS/TDAP  01/05/2030  . COVID-19 Vaccine  Completed  . HIV Screening  Completed    Discussed health benefits of physical activity, and encouraged her to engage in regular exercise appropriate for her age and condition.  Problem List Items Addressed This Visit      Endocrine   Adult hypothyroidism    Previously well controlled Continue Synthroid at current dose  Recheck TSH and adjust Synthroid as indicated        Relevant Orders   TSH     Other   GAD (generalized anxiety disorder)    Chronic and well controlled  continue zoloft 50mg  daily Taking xanax very infrequently      Relevant Medications   sertraline (ZOLOFT) 50 MG tablet    Other Visit Diagnoses    Annual physical exam    -  Primary   Relevant Orders   Comprehensive metabolic panel   Hepatitis C antibody   HIV Antibody (routine testing w rflx)   Lipid Panel With LDL/HDL Ratio   TSH   MM 3D SCREEN BREAST BILATERAL   Need for influenza vaccination       Relevant Orders   Flu Vaccine QUAD 36+ mos IM   Breast cancer screening by mammogram       Relevant Orders  MM 3D SCREEN BREAST BILATERAL   Encounter for screening for HIV       Relevant Orders   HIV Antibody (routine testing w rflx)   Need for hepatitis C screening test       Relevant Orders   Hepatitis C antibody       Return in about 6 months (around 05/30/2021) for chronic disease f/u.     I, Shirlee Latch, MD, have reviewed all documentation for this visit. The documentation on 11/29/20 for the exam, diagnosis, procedures, and orders are all accurate and complete.   Kanen Mottola, Marzella Schlein, MD, MPH [facility]

## 2020-11-30 ENCOUNTER — Encounter: Payer: Self-pay | Admitting: Family Medicine

## 2020-11-30 LAB — COMPREHENSIVE METABOLIC PANEL
ALT: 11 IU/L (ref 0–32)
AST: 10 IU/L (ref 0–40)
Albumin/Globulin Ratio: 2 (ref 1.2–2.2)
Albumin: 4.3 g/dL (ref 3.8–4.9)
Alkaline Phosphatase: 61 IU/L (ref 44–121)
BUN/Creatinine Ratio: 19 (ref 9–23)
BUN: 11 mg/dL (ref 6–24)
Bilirubin Total: 0.4 mg/dL (ref 0.0–1.2)
CO2: 25 mmol/L (ref 20–29)
Calcium: 9 mg/dL (ref 8.7–10.2)
Chloride: 99 mmol/L (ref 96–106)
Creatinine, Ser: 0.59 mg/dL (ref 0.57–1.00)
GFR calc Af Amer: 120 mL/min/{1.73_m2} (ref >59)
GFR calc non Af Amer: 104 mL/min/{1.73_m2} (ref >59)
Globulin, Total: 2.1 g/dL (ref 1.5–4.5)
Glucose: 95 mg/dL (ref 65–99)
Potassium: 4.2 mmol/L (ref 3.5–5.2)
Sodium: 137 mmol/L (ref 134–144)
Total Protein: 6.4 g/dL (ref 6.0–8.5)

## 2020-11-30 LAB — LIPID PANEL WITH LDL/HDL RATIO
Cholesterol, Total: 261 mg/dL — ABNORMAL HIGH (ref 100–199)
HDL: 51 mg/dL (ref >39)
LDL Chol Calc (NIH): 197 mg/dL — ABNORMAL HIGH (ref 0–99)
LDL/HDL Ratio: 3.9 ratio — ABNORMAL HIGH (ref 0.0–3.2)
Triglycerides: 80 mg/dL (ref 0–149)
VLDL Cholesterol Cal: 13 mg/dL (ref 5–40)

## 2020-11-30 LAB — HEPATITIS C ANTIBODY: Hep C Virus Ab: <0.1 s/co ratio (ref 0.0–0.9)

## 2020-11-30 LAB — TSH: TSH: 1.21 u[IU]/mL (ref 0.450–4.500)

## 2020-11-30 LAB — HIV ANTIBODY (ROUTINE TESTING W REFLEX): HIV Screen 4th Generation wRfx: NONREACTIVE

## 2021-01-11 ENCOUNTER — Other Ambulatory Visit: Payer: BC Managed Care – PPO

## 2021-01-14 ENCOUNTER — Other Ambulatory Visit: Payer: Self-pay | Admitting: Family Medicine

## 2021-06-01 ENCOUNTER — Ambulatory Visit: Payer: Self-pay | Admitting: Family Medicine

## 2021-06-21 ENCOUNTER — Other Ambulatory Visit: Payer: Self-pay | Admitting: Family Medicine

## 2021-07-19 ENCOUNTER — Ambulatory Visit: Payer: Self-pay | Admitting: *Deleted

## 2021-07-19 NOTE — Telephone Encounter (Signed)
Patient calls with a right upper abdominal pain episode 3 days ago that was constant for 5-6 hours along with nausea. Pain did not radiate, denies CP and SOB. Had a similar episode 2 months ago.  Did state she was on vacation and her diet was different and worse than usual along with some alcohol consumption. LBM 2 days ago. Denies any pain since the Sunday night episode. She would like to be seen earlier than pcp has opening. Routing to office for contact with patient.   Answer Assessment - Initial Assessment Questions 1. LOCATION: "Where does it hurt?"      Right upper abdomen 2. RADIATION: "Does the pain shoot anywhere else?" (e.g., chest, back)     Entire upper right side hurt 3. ONSET: "When did the pain begin?" (e.g., minutes, hours or days ago)      Sunday evening 4. SUDDEN: "Gradual or sudden onset?"     Gradual onset 5. PATTERN "Does the pain come and go, or is it constant?"    - If constant: "Is it getting better, staying the same, or worsening?"      (Note: Constant means the pain never goes away completely; most serious pain is constant and it progresses)     - If intermittent: "How long does it last?" "Do you have pain now?"     (Note: Intermittent means the pain goes away completely between bouts)     5-6 hours 6. SEVERITY: "How bad is the pain?"  (e.g., Scale 1-10; mild, moderate, or severe)    - MILD (1-3): doesn't interfere with normal activities, abdomen soft and not tender to touch     - MODERATE (4-7): interferes with normal activities or awakens from sleep, abdomen tender to touch     - SEVERE (8-10): excruciating pain, doubled over, unable to do any normal activities        7. RECURRENT SYMPTOM: "Have you ever had this type of stomach pain before?" If Yes, ask: "When was the last time?" and "What happened that time?"      Yes about 2 months ago. 8. AGGRAVATING FACTORS: "Does anything seem to cause this pain?" (e.g., foods, stress, alcohol)     nothing 9. CARDIAC  SYMPTOMS: "Do you have any of the following symptoms: chest pain, difficulty breathing, sweating, nausea?"     nausea 10. OTHER SYMPTOMS: "Do you have any other symptoms?" (e.g., back pain, diarrhea, fever, urination pain, vomiting)       none 11. PREGNANCY: "Is there any chance you are pregnant?" "When was your last menstrual period?"       na  Protocols used: Abdominal Pain - Upper-A-AH

## 2021-07-19 NOTE — Telephone Encounter (Signed)
Scheduled pt with Robynn Pane 7/28 at 3:40 pm. Pt advised

## 2021-07-20 ENCOUNTER — Other Ambulatory Visit: Payer: Self-pay

## 2021-07-20 ENCOUNTER — Ambulatory Visit (INDEPENDENT_AMBULATORY_CARE_PROVIDER_SITE_OTHER): Payer: BC Managed Care – PPO | Admitting: Family Medicine

## 2021-07-20 ENCOUNTER — Encounter: Payer: Self-pay | Admitting: Family Medicine

## 2021-07-20 VITALS — BP 104/76 | HR 90 | Temp 98.7°F | Resp 15 | Wt 162.6 lb

## 2021-07-20 DIAGNOSIS — R1011 Right upper quadrant pain: Secondary | ICD-10-CM | POA: Diagnosis not present

## 2021-07-20 DIAGNOSIS — K8050 Calculus of bile duct without cholangitis or cholecystitis without obstruction: Secondary | ICD-10-CM | POA: Diagnosis not present

## 2021-07-20 NOTE — Progress Notes (Signed)
Established patient visit   Patient: Laurie Peterson   DOB: 1965/12/26   55 y.o. Female  MRN: [id number] Visit Date: 07/20/2021  Today's healthcare provider: Jacky Kindle, FNP   Chief Complaint  Patient presents with   Abdominal Pain   Subjective    Abdominal Pain This is a recurrent problem. The current episode started more than 1 month ago (recent flare up started 4 days ago). The onset quality is sudden. The pain is located in the RUQ. The quality of the pain is aching. The abdominal pain radiates to the right shoulder. Associated symptoms include belching, flatus and nausea. Pertinent negatives include no anorexia, arthralgias, constipation, diarrhea, dysuria, fever, frequency, headaches, hematochezia, hematuria, melena, myalgias, vomiting or weight loss. The pain is relieved by Nothing. Treatments tried: Pepto- Bismol. There is no history of abdominal surgery, colon cancer, Crohn's disease, gallstones, GERD, irritable bowel syndrome, pancreatitis, PUD or ulcerative colitis.      Medications: Outpatient Medications Prior to Visit  Medication Sig   levothyroxine (SYNTHROID) 137 MCG tablet TAKE 1 TABLET BY MOUTH  DAILY BEFORE BREAKFAST   sertraline (ZOLOFT) 50 MG tablet Take 1 tablet (50 mg total) by mouth daily.   ALPRAZolam (XANAX) 0.5 MG tablet Take 0.5-1 tablets (0.25-0.5 mg total) by mouth at bedtime as needed for anxiety. (Patient not taking: Reported on 07/20/2021)   No facility-administered medications prior to visit.    Review of Systems  Constitutional:  Negative for fever and weight loss.  Gastrointestinal:  Positive for abdominal pain, flatus and nausea. Negative for anorexia, constipation, diarrhea, hematochezia, melena and vomiting.  Genitourinary:  Negative for dysuria, frequency and hematuria.  Musculoskeletal:  Negative for arthralgias and myalgias.  Neurological:  Negative for headaches.      Objective    BP 104/76   Pulse 90   Temp 98.7 F (37.1 C)  (Oral)   Resp 15   Wt 162 lb 9.6 oz (73.8 kg)   SpO2 99%   BMI 26.24 kg/m     Physical Exam Vitals and nursing note reviewed.  Constitutional:      Appearance: She is well-developed.  Cardiovascular:     Rate and Rhythm: Normal rate and regular rhythm.     Pulses:          Carotid pulses are 2+ on the right side and 2+ on the left side.      Radial pulses are 2+ on the right side and 2+ on the left side.       Femoral pulses are 2+ on the right side and 2+ on the left side.      Popliteal pulses are 2+ on the right side and 2+ on the left side.       Dorsalis pedis pulses are 2+ on the right side and 2+ on the left side.       Posterior tibial pulses are 2+ on the right side and 2+ on the left side.     Heart sounds: Normal heart sounds, S1 normal and S2 normal. No murmur heard.   No friction rub. No gallop.  Pulmonary:     Effort: Pulmonary effort is normal.     Breath sounds: Normal breath sounds.  Abdominal:     General: Abdomen is flat. Bowel sounds are normal. There is no distension. There are no signs of injury.     Palpations: Abdomen is soft. There is no fluid wave, hepatomegaly or mass.     Tenderness: There  is no abdominal tenderness. Negative signs include Murphy's sign.     Hernia: No hernia is present.  Musculoskeletal:     Right lower leg: No edema.     Left lower leg: No edema.  Skin:    General: Skin is warm and dry.     Capillary Refill: Capillary refill takes 2 to 3 seconds.  Neurological:     General: No focal deficit present.     Mental Status: She is alert and oriented to person, place, and time.  Psychiatric:        Mood and Affect: Mood normal.        Behavior: Behavior normal.    No results found for any visits on 07/20/21.  Assessment & Plan     Problem List Items Addressed This Visit       Other   Postprandial RUQ pain - Primary    Second bout within the last year Noted when out of town at [employer]- diet change from Mediterranean diet' to  'all American' diet, as well as alcohol consumption Pt had previously been on keto diet, losing 70#, but switched to Med as a result of inc in lipid panel.   Negative Murphy sign today No abdominal tenderness, guarding, negative for rebound pain Plan for abdominal US and lab work to check liver involvement if pain arises again - Exercise at least 30-45 minutes a day, 3-4 days a week.  - Eat a low-fat diet with lots of fruits and vegetables, up to 7-9 servings per day.  -limit alcohol consumption to 1-2 servings/day        Recurrent biliary colic    Combination of both typical and atypical symptoms based on HPI No symptoms currently Exam negative No need for [place] and lab work today given no symptoms Diet intervention reviewed         Follow up as needed Complete Physical exam mid December, 2022  I, Jacky Kindle, FNP, have reviewed all documentation for this visit. The documentation on 07/20/21 for the exam, diagnosis, procedures, and orders are all accurate and complete.  Jacky Kindle, FNP  [facility] [phone] (phone) [phone] (fax)  [facility]

## 2021-07-20 NOTE — Assessment & Plan Note (Signed)
Second bout within the last year Noted when out of town at [employer] change from [employer]' to 'all American' diet, as well as alcohol consumption Pt had previously been on keto diet, losing 70#, but switched to Med as a result of inc in lipid panel.   Negative Murphy sign today No abdominal tenderness, guarding, negative for rebound pain Plan for abdominal US and lab work to check liver involvement if pain arises again - Exercise at least 30-45 minutes a day, 3-4 days a week.  - Eat a low-fat diet with lots of fruits and vegetables, up to 7-9 servings per day.  -limit alcohol consumption to 1-2 servings/day

## 2021-07-20 NOTE — Assessment & Plan Note (Signed)
Combination of both typical and atypical symptoms based on HPI No symptoms currently Exam negative No need for [place] and lab work today given no symptoms Diet intervention reviewed

## 2021-09-05 ENCOUNTER — Encounter: Payer: Self-pay | Admitting: Family Medicine

## 2021-09-14 ENCOUNTER — Other Ambulatory Visit: Payer: Self-pay | Admitting: Family Medicine

## 2021-10-19 DIAGNOSIS — N8110 Cystocele, unspecified: Secondary | ICD-10-CM | POA: Diagnosis not present

## 2021-10-19 DIAGNOSIS — R339 Retention of urine, unspecified: Secondary | ICD-10-CM | POA: Diagnosis not present

## 2021-10-24 ENCOUNTER — Ambulatory Visit: Payer: Self-pay | Admitting: *Deleted

## 2021-10-24 ENCOUNTER — Other Ambulatory Visit: Payer: Self-pay | Admitting: Family Medicine

## 2021-10-24 DIAGNOSIS — M549 Dorsalgia, unspecified: Secondary | ICD-10-CM

## 2021-10-24 MED ORDER — BACLOFEN 10 MG PO TABS: 10.0000 mg | ORAL_TABLET | Freq: Three times a day (TID) | ORAL | 0 refills | Status: DC

## 2021-10-24 NOTE — Telephone Encounter (Signed)
Reason for Disposition  [1] MODERATE back pain (e.g., interferes with normal activities) AND [2] present > 3 days  Answer Assessment - Initial Assessment Questions 1. ONSET: "When did the pain begin?"      Friday night- woke patient 2. LOCATION: "Where does it hurt?" (upper, mid or lower back)     Lower back- muscle spasms 3. SEVERITY: "How bad is the pain?"  (e.g., Scale 1-10; mild, moderate, or severe)   - MILD (1-3): doesn't interfere with normal activities    - MODERATE (4-7): interferes with normal activities or awakens from sleep    - SEVERE (8-10): excruciating pain, unable to do any normal activities      moderate 4. PATTERN: "Is the pain constant?" (e.g., yes, no; constant, intermittent)      constant 5. RADIATION: "Does the pain shoot into your legs or elsewhere?"moderate     Lower back only 6. CAUSE:  "What do you think is causing the back pain?"      Possible pulled/pinched nerve- spasms-worse when walking 7. 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 Saturday and Sunday patient was very careful- she bent over to talk to child today and felt it catch. Patient is requesting muscle relaxer for night 8. MEDICATIONS: "What have you taken so far for the pain?" (e.g., nothing, acetaminophen, NSAIDS)     Not using anything now 9. NEUROLOGIC SYMPTOMS: "Do you have any weakness, numbness, or problems with bowel/bladder control?"     no 10. OTHER SYMPTOMS: "Do you have any other symptoms?" (e.g., fever, abdominal pain, burning with urination, blood in urine)       no 11. PREGNANCY: "Is there any chance you are pregnant?" (e.g., yes, no; LMP)       na  Protocols used: Back Pain-A-AH

## 2021-10-24 NOTE — Telephone Encounter (Signed)
Patient advised.

## 2021-10-24 NOTE — Progress Notes (Signed)
aclo

## 2021-10-24 NOTE — Telephone Encounter (Signed)
Patient agreed to be seen at [facility] today at 4pm.

## 2021-10-24 NOTE — Telephone Encounter (Signed)
PA at [facility] was not able to see patient today. Patient advised. Patient is requesting a muscle relaxer, she reports she has taken Ibuprofen and reports mild symptom control. Please advise. Patient uses [facility] in [place]

## 2021-10-24 NOTE — Telephone Encounter (Signed)
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 Patient states she woke Friday night with severe pain that made her fall twice. She thinks she pinched a nerve and is having spasms. Patient did not have problem on Saturday/Sunday- she was careful. Today she had pain when she bent over to assist child. Patient is requesting muscle relaxer for nighttime. Patient advised she will need to be seen- patient available after 3:15 today and tomorrow before 12. No appointment available- call sent for review

## 2021-11-15 DIAGNOSIS — Z4689 Encounter for fitting and adjustment of other specified devices: Secondary | ICD-10-CM | POA: Diagnosis not present

## 2021-11-15 DIAGNOSIS — N8110 Cystocele, unspecified: Secondary | ICD-10-CM | POA: Diagnosis not present

## 2021-11-15 DIAGNOSIS — R339 Retention of urine, unspecified: Secondary | ICD-10-CM | POA: Diagnosis not present

## 2021-11-27 ENCOUNTER — Telehealth (INDEPENDENT_AMBULATORY_CARE_PROVIDER_SITE_OTHER): Payer: BC Managed Care – PPO | Admitting: Family Medicine

## 2021-11-27 ENCOUNTER — Other Ambulatory Visit: Payer: Self-pay

## 2021-11-27 ENCOUNTER — Telehealth: Payer: Self-pay

## 2021-11-27 ENCOUNTER — Encounter: Payer: Self-pay | Admitting: Family Medicine

## 2021-11-27 DIAGNOSIS — B9689 Other specified bacterial agents as the cause of diseases classified elsewhere: Secondary | ICD-10-CM | POA: Diagnosis not present

## 2021-11-27 DIAGNOSIS — H109 Unspecified conjunctivitis: Secondary | ICD-10-CM | POA: Diagnosis not present

## 2021-11-27 MED ORDER — POLYMYXIN B-TRIMETHOPRIM 10000-0.1 UNIT/ML-% OP SOLN: 2.0000 [drp] | Freq: Four times a day (QID) | OPHTHALMIC | 0 refills | Status: AC

## 2021-11-27 NOTE — Telephone Encounter (Signed)
I think Dr Linwood Dibbles has openings in her schedule today and could do virtual visit to check it out

## 2021-11-27 NOTE — Telephone Encounter (Signed)
Lm advising as below. PEC please schedule when patient calls back.

## 2021-11-27 NOTE — Progress Notes (Signed)
MyChart Video Visit    Virtual Visit via Video Note   This visit type was conducted due to national recommendations for restrictions regarding the COVID-19 Pandemic (e.g. social distancing) in an effort to limit this patient's exposure and mitigate transmission in our community. This patient is at least at moderate risk for complications without adequate follow up. This format is felt to be most appropriate for this patient at this time. Physical exam was limited by quality of the video and audio technology used for the visit.    Patient location: home Provider location: [facility] Persons involved in the visit: patient, provider  I discussed the limitations of evaluation and management by telemedicine and the availability of in person appointments. The patient expressed understanding and agreed to proceed.  Patient: Laurie Peterson   DOB: 1966/04/02   55 y.o. Female  MRN: [id number] Visit Date: 11/27/2021  Today's healthcare provider: Shirlee Latch, MD   Chief Complaint  Patient presents with   Conjunctivitis   Subjective    HPI HPI     Conjunctivitis   In both eyes.  Associated symptoms include irritation and discharge.  Pain was noted as 0/10.  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  Since onset it is stable.  Treatments tried: "Some expired drops for pink eye".      Last edited by Marjie Skiff, CMA on 11/27/2021  4:00 PM.      Ongoing x3 days R>L No vision changes, fevers, URI symptoms +Crusting of both eyes   Medications: Outpatient Medications Prior to Visit  Medication Sig   levothyroxine (SYNTHROID) 137 MCG tablet TAKE 1 TABLET BY MOUTH  DAILY BEFORE BREAKFAST   sertraline (ZOLOFT) 50 MG tablet Take 1 tablet (50 mg total) by mouth daily.   ALPRAZolam (XANAX) 0.5 MG tablet Take 0.5-1 tablets (0.25-0.5 mg total) by mouth at bedtime as needed for anxiety. (Patient not taking: Reported on 07/20/2021)   baclofen (LIORESAL) 10 MG tablet Take 1  tablet (10 mg total) by mouth 3 (three) times daily. (Patient not taking: Reported on 11/27/2021)   No facility-administered medications prior to visit.    Review of Systems per HPI    Objective    There were no vitals taken for this visit.   Physical Exam Constitutional:      General: She is not in acute distress.    Appearance: Normal appearance.  HENT:     Head: Normocephalic.  Eyes:     General: Lids are normal. No scleral icterus.       Right eye: Discharge present.        Left eye: Discharge present.    Extraocular Movements: Extraocular movements intact.     Conjunctiva/sclera:     Right eye: Right conjunctiva is injected.     Left eye: Left conjunctiva is injected.  Pulmonary:     Effort: Pulmonary effort is normal. No respiratory distress.  Neurological:     Mental Status: She is alert and oriented to person, place, and time. Mental status is at baseline.       Assessment & Plan     1. Bacterial conjunctivitis of both eyes - new problem - known exposure - symptoms classic for bacterial conjunctivitis with no other viral symptoms - treat x5-7 days - return precautions discussed  Meds ordered this encounter  Medications   trimethoprim-polymyxin b (POLYTRIM) ophthalmic solution    Sig: Place 2 drops into both eyes every 6 (six) hours for 7 days.  Dispense:  10 mL    Refill:  0     Return if symptoms worsen or fail to improve.     I discussed the assessment and treatment plan with the patient. The patient was provided an opportunity to ask questions and all were answered. The patient agreed with the plan and demonstrated an understanding of the instructions.   The patient was advised to call back or seek an in-person evaluation if the symptoms worsen or if the condition fails to improve as anticipated.  I, Shirlee Latch, MD, have reviewed all documentation for this visit. The documentation on 11/27/21 for the exam, diagnosis, procedures, and orders  are all accurate and complete.   Star Resler, Marzella Schlein, MD, MPH [facility]

## 2021-11-27 NOTE — Telephone Encounter (Signed)
Copied from CRM [id number]. Topic: Appointment Scheduling - Scheduling Inquiry for Clinic >> Nov 27, 2021  9:03 AM Darrick Penna, Hospital doctor R wrote: Reason for CRM: Patient called in stating that she woke up with pink eye on 11-25-21, she states that she has been using expired eye drops from two years ago, Patient requesting to have medication refilled, was advised by brittany to send a high priority CRM to get information back to clinical team. Please advise.

## 2021-11-27 NOTE — Telephone Encounter (Signed)
Scheduled patient for today at 4pm mychart video visit

## 2021-11-30 ENCOUNTER — Other Ambulatory Visit: Payer: Self-pay | Admitting: Family Medicine

## 2021-12-01 NOTE — Telephone Encounter (Signed)
Requested Prescriptions  Pending Prescriptions Disp Refills  . sertraline (ZOLOFT) 50 MG tablet [Pharmacy Med Name: Sertraline HCl 50 MG Oral Tablet] 90 tablet 0    Sig: TAKE 1 TABLET BY MOUTH  DAILY     Psychiatry:  Antidepressants - SSRI Passed - 11/30/2021 11:33 PM      Passed - Valid encounter within last 6 months    Recent Outpatient Visits          4 days ago Bacterial conjunctivitis of both eyes   [facility] [place], Marzella Schlein, MD   4 months ago Postprandial RUQ pain   [facility] Jacky Kindle, FNP   1 year ago Annual physical exam   [facility], Marzella Schlein, MD   1 year ago GAD (generalized anxiety disorder)   [facility], Marzella Schlein, MD   2 years ago GAD (generalized anxiety disorder)   [facility] Bacigal[place], Marzella Schlein, MD      Future Appointments            In 1 month Bacigalupo, Marzella Schlein, MD [facility], [facility]

## 2021-12-04 DIAGNOSIS — H1045 Other chronic allergic conjunctivitis: Secondary | ICD-10-CM | POA: Diagnosis not present

## 2022-01-19 NOTE — Progress Notes (Deleted)
Complete physical exam   Patient: Laurie Peterson   DOB: July 18, 1966   56 y.o. Female  MRN: [id number] Visit Date: 01/22/2022  Today's healthcare provider: Shirlee Latch, MD   No chief complaint on file.  Subjective    Laurie Peterson is a 56 y.o. female who presents today for a complete physical exam.  She reports consuming a {diet types:17450} diet. {Exercise:19826} She generally feels {well/fairly well/poorly:18703}. She reports sleeping {well/fairly well/poorly:18703}. She {does/does not:200015} have additional problems to discuss today.  HPI  ***  No past medical history on file. No past surgical history on file. Social History   Socioeconomic History   Marital status: Married    Spouse name: Not on file   Number of children: Not on file   Years of education: Not on file   Highest education level: Not on file  Occupational History   Not on file  Tobacco Use   Smoking status: Never   Smokeless tobacco: Never  Substance and Sexual Activity   Alcohol use: No    Alcohol/week: 0.0 standard drinks   Drug use: Not on file   Sexual activity: Not on file  Other Topics Concern   Not on file  Social History Narrative   Not on file   Social Determinants of Health   Financial Resource Strain: Not on file  Food Insecurity: Not on file  Transportation Needs: Not on file  Physical Activity: Not on file  Stress: Not on file  Social Connections: Not on file  Intimate Partner Violence: Not on file   Family Status  Relation Name Status   Neg Hx  (Not Specified)   Family History  Problem Relation Age of Onset   Breast cancer Neg Hx    No Known Allergies  Patient Care Team: Erasmo Downer, MD as PCP - General (Family Medicine)   Medications: Outpatient Medications Prior to Visit  Medication Sig   ALPRAZolam (XANAX) 0.5 MG tablet Take 0.5-1 tablets (0.25-0.5 mg total) by mouth at bedtime as needed for anxiety. (Patient not taking: Reported on 07/20/2021)    baclofen (LIORESAL) 10 MG tablet Take 1 tablet (10 mg total) by mouth 3 (three) times daily. (Patient not taking: Reported on 11/27/2021)   levothyroxine (SYNTHROID) 137 MCG tablet TAKE 1 TABLET BY MOUTH  DAILY BEFORE BREAKFAST   sertraline (ZOLOFT) 50 MG tablet TAKE 1 TABLET BY MOUTH  DAILY   No facility-administered medications prior to visit.    Review of Systems  {Labs   Heme   Chem   Endocrine   Serology   Results Review (optional):23779}  Objective    There were no vitals taken for this visit. {Show previous vital signs (optional):23777}  Physical Exam  ***  Last depression screening scores PHQ 2/9 Scores 11/29/2020 03/30/2019  PHQ - 2 Score 0 0  PHQ- 9 Score 0 0   Last fall risk screening Fall Risk  11/29/2020  Falls in the past year? 0  Number falls in past yr: 0  Injury with Fall? 0  Risk for fall due to : No Fall Risks  Follow up Falls evaluation completed   Last Audit-C alcohol use screening Alcohol Use Disorder Test (AUDIT) 11/29/2020  1. How often do you have a drink containing alcohol? 0  2. How many drinks containing alcohol do you have on a typical day when you are drinking? 0  3. How often do you have six or more drinks on one occasion? 0  AUDIT-C  Score 0  Alcohol Brief Interventions/Follow-up AUDIT Score <7 follow-up not indicated   A score of 3 or more in women, and 4 or more in men indicates increased risk for alcohol abuse, EXCEPT if all of the points are from question 1   No results found for any visits on 01/22/22.  Assessment & Plan    Routine Health Maintenance and Physical Exam  Exercise Activities and Dietary recommendations  Goals   None     Immunization History  Administered Date(s) Administered   Influenza Nasal 10/16/2008   Influenza,inj,Quad PF,6+ Mos 10/15/2019, 11/29/2020   Moderna Sars-Covid-2 Vaccination 02/27/2020, 04/02/2020   Tdap 01/06/2020    Health Maintenance  Topic Date Due   Zoster Vaccines- Shingrix (1 of 2) Never  done   COVID-19 Vaccine (3 - Booster for Moderna series) 05/28/2020   MAMMOGRAM  01/27/2021   INFLUENZA VACCINE  07/24/2021   PAP SMEAR-Modifier  11/26/2022   COLONOSCOPY (Pts 45-30yrs Insurance coverage will need to be confirmed)  02/15/2028   TETANUS/TDAP  01/05/2030   Hepatitis C Screening  Completed   HIV Screening  Completed   HPV VACCINES  Aged Out    Discussed health benefits of physical activity, and encouraged her to engage in regular exercise appropriate for her age and condition.  ***  No follow-ups on file.     {provider attestation***:1}   Shirlee Latch, MD  [facility] [phone] (phone) [phone] (fax)  [facility]

## 2022-01-22 ENCOUNTER — Encounter: Payer: Self-pay | Admitting: Family Medicine

## 2022-01-24 DIAGNOSIS — N8110 Cystocele, unspecified: Secondary | ICD-10-CM | POA: Diagnosis not present

## 2022-01-24 DIAGNOSIS — R339 Retention of urine, unspecified: Secondary | ICD-10-CM | POA: Diagnosis not present

## 2022-01-24 DIAGNOSIS — Z4689 Encounter for fitting and adjustment of other specified devices: Secondary | ICD-10-CM | POA: Diagnosis not present

## 2022-01-25 DIAGNOSIS — N8110 Cystocele, unspecified: Secondary | ICD-10-CM | POA: Diagnosis not present

## 2022-01-25 DIAGNOSIS — Z4689 Encounter for fitting and adjustment of other specified devices: Secondary | ICD-10-CM | POA: Diagnosis not present

## 2022-01-25 DIAGNOSIS — R339 Retention of urine, unspecified: Secondary | ICD-10-CM | POA: Diagnosis not present

## 2022-02-12 ENCOUNTER — Other Ambulatory Visit: Payer: Self-pay | Admitting: Family Medicine

## 2022-02-28 ENCOUNTER — Other Ambulatory Visit: Payer: Self-pay | Admitting: Family Medicine

## 2022-03-01 DIAGNOSIS — N816 Rectocele: Secondary | ICD-10-CM | POA: Diagnosis not present

## 2022-03-01 DIAGNOSIS — N812 Incomplete uterovaginal prolapse: Secondary | ICD-10-CM | POA: Diagnosis not present

## 2022-03-01 DIAGNOSIS — Z6824 Body mass index (BMI) 24.0-24.9, adult: Secondary | ICD-10-CM | POA: Diagnosis not present

## 2022-03-01 NOTE — Telephone Encounter (Signed)
Requested medication (s) are due for refill today: yea ? ?Requested medication (s) are on the active medication list: yes ? ?Last refill:  09/14/21 #90/1 ? ?Future visit scheduled: no ? ?Notes to clinic:  Unable to refill per protocol due to failed labs, no updated results. ? ? ? ?  ?Requested Prescriptions  ?Pending Prescriptions Disp Refills  ? levothyroxine (SYNTHROID) 137 MCG tablet [Pharmacy Med Name: Levothyroxine Sodium 137 MCG Oral Tablet] 90 tablet 3  ?  Sig: TAKE 1 TABLET BY MOUTH  DAILY BEFORE BREAKFAST  ?  ? Endocrinology:  Hypothyroid Agents Failed - 02/28/2022 10:04 PM  ?  ?  Failed - TSH in normal range and within 360 days  ?  TSH  ?Date Value Ref Range Status  ?11/29/2020 1.210 0.450 - 4.500 uIU/mL Final  ?  ?  ?  ?  Passed - Valid encounter within last 12 months  ?  Recent Outpatient Visits   ? ?      ? 3 months ago Bacterial conjunctivitis of both eyes  ? [facility] [place], Marzella Schlein, MD  ? 7 months ago Postprandial RUQ pain  ? [facility] Merita Norton T, FNP  ? 1 year ago Annual physical exam  ? [facility], Marzella Schlein, MD  ? 2 years ago GAD (generalized anxiety disorder)  ? [facility] [place], Marzella Schlein, MD  ? 2 years ago GAD (generalized anxiety disorder)  ? [facility] Bacigalupo, Marzella Schlein, MD  ? ?  ?  ? ?  ?  ?  ? ?

## 2022-03-02 NOTE — Progress Notes (Signed)
?  ? ?I,Joseline E Rosas,acting as a scribe for Shirlee Latch, MD.,have documented all relevant documentation on the behalf of Shirlee Latch, MD,as directed by  Shirlee Latch, MD while in the presence of Shirlee Latch, MD.  ? ?Established patient visit ? ? ?Patient: Laurie Peterson   DOB: 04-29-1966   56 y.o. Female  MRN: [id number] ?Visit Date: 03/05/2022 ? ?Today's healthcare provider: Shirlee Latch, MD  ? ?Chief Complaint  ?Patient presents with  ? follow-up chronic disease  ? ?Subjective  ?  ?HPI  ? ?Hypothyroid, follow-up ? ?Lab Results  ?Component Value Date  ? TSH 1.210 11/29/2020  ? TSH 4.070 01/06/2020  ? TSH 0.78 01/07/2019  ? ? ?Wt Readings from Last 3 Encounters:  ?03/05/22 158 lb (71.7 kg)  ?07/20/21 162 lb 9.6 oz (73.8 kg)  ?11/29/20 158 lb (71.7 kg)  ? ? ?She was last seen for hypothyroid 15 months ago.  ?Management since that visit includes no changes. ?She reports excellent compliance with treatment. ?She is not having side effects.  ? ?Symptoms: ?No change in energy level No constipation  ?No diarrhea No heat / cold intolerance  ?No nervousness No palpitations  ?No weight changes   ? ?----------------------------------------------------------------------------------------- ?Anxiety, Follow-up ? ?She was last seen for anxiety 15 months ago. ?Changes made at last visit include continue zoloft 50mg  daily and taking xanax very infrequently. ?  ?She reports excellent compliance with treatment. ?She reports excellent tolerance of treatment. ?She is not having side effects.  ? ?She feels her anxiety is mild and Improved since last visit. ? ?Symptoms: ?No chest pain No difficulty concentrating  ?No dizziness No fatigue  ?No feelings of losing control Yes insomnia  ?No irritable No palpitations  ?No panic attacks No racing thoughts  ?No shortness of breath No sweating  ?No tremors/shakes   ? ?GAD-7 Results ?GAD-7 Generalized Anxiety Disorder Screening Tool 03/05/2022 01/06/2020 03/30/2019  ?1.  Feeling Nervous, Anxious, or on Edge 0 0 1  ?2. Not Being Able to Stop or Control Worrying 0 0 0  ?3. Worrying Too Much About Different Things 0 1 0  ?4. Trouble Relaxing 0 1 0  ?5. Being So Restless it's Hard To Sit Still 0 1 1  ?6. Becoming Easily Annoyed or Irritable 0 0 0  ?7. Feeling Afraid As If Something Awful Might Happen 0 0 1  ?Total GAD-7 Score 0 3 3  ?Difficulty At Work, Home, or Getting  Along With Others? Not difficult at all Not difficult at all Not difficult at all  ? ? ?PHQ-9 Scores ?PHQ9 SCORE ONLY 03/05/2022 11/29/2020 03/30/2019  ?PHQ-9 Total Score 0 0 0  ? ? ?--------------------------------------------------------------------------------------------------- ? ? ? ?Medications: ?Outpatient Medications Prior to Visit  ?Medication Sig  ? [DISCONTINUED] levothyroxine (SYNTHROID) 137 MCG tablet TAKE 1 TABLET BY MOUTH  DAILY BEFORE BREAKFAST  ? [DISCONTINUED] sertraline (ZOLOFT) 50 MG tablet Take 1 tablet (50 mg total) by mouth daily. Please schedule office visit before any future refill.  ? ALPRAZolam (XANAX) 0.5 MG tablet Take 0.5-1 tablets (0.25-0.5 mg total) by mouth at bedtime as needed for anxiety. (Patient not taking: Reported on 03/05/2022)  ? [DISCONTINUED] baclofen (LIORESAL) 10 MG tablet Take 1 tablet (10 mg total) by mouth 3 (three) times daily. (Patient not taking: Reported on 11/27/2021)  ? ?No facility-administered medications prior to visit.  ? ? ?Review of Systems per HPI ? ?  ?  Objective  ?  ?BP 106/72 (BP Location: Left Arm, Patient Position: Sitting, Cuff Size:  Normal)   Pulse 73   Temp (!) 97.5 ?F (36.4 ?C) (Oral)   Resp 16   Ht 5\' 7"  (1.702 m)   Wt 158 lb (71.7 kg)   BMI 24.75 kg/m?  ?BP Readings from Last 3 Encounters:  ?03/05/22 106/72  ?07/20/21 104/76  ?11/29/20 114/76  ? ?Wt Readings from Last 3 Encounters:  ?03/05/22 158 lb (71.7 kg)  ?07/20/21 162 lb 9.6 oz (73.8 kg)  ?11/29/20 158 lb (71.7 kg)  ? ?  ? ?Physical Exam ?Vitals reviewed.  ?Constitutional:   ?   General: She  is not in acute distress. ?   Appearance: Normal appearance. She is well-developed. She is not diaphoretic.  ?HENT:  ?   Head: Normocephalic and atraumatic.  ?Eyes:  ?   General: No scleral icterus. ?   Conjunctiva/sclera: Conjunctivae normal.  ?Neck:  ?   Thyroid: No thyromegaly.  ?Cardiovascular:  ?   Rate and Rhythm: Normal rate and regular rhythm.  ?   Pulses: Normal pulses.  ?   Heart sounds: Normal heart sounds. No murmur heard. ?Pulmonary:  ?   Effort: Pulmonary effort is normal. No respiratory distress.  ?   Breath sounds: Normal breath sounds. No wheezing, rhonchi or rales.  ?Musculoskeletal:  ?   Cervical back: Neck supple.  ?   Right lower leg: No edema.  ?   Left lower leg: No edema.  ?Lymphadenopathy:  ?   Cervical: No cervical adenopathy.  ?Skin: ?   General: Skin is warm and dry.  ?   Findings: No rash.  ?Neurological:  ?   Mental Status: She is alert and oriented to person, place, and time. Mental status is at baseline.  ?Psychiatric:     ?   Mood and Affect: Mood normal.     ?   Behavior: Behavior normal.  ?  ? ? ?No results found for any visits on 03/05/22. ? Assessment & Plan  ?  ? ?Problem List Items Addressed This Visit   ? ?  ? Endocrine  ? Adult hypothyroidism - Primary  ?  Previously well controlled ?Continue Synthroid at current dose  ?Recheck TSH and adjust Synthroid as indicated   ?  ?  ? Relevant Medications  ? levothyroxine (SYNTHROID) 137 MCG tablet  ? Other Relevant Orders  ? TSH  ?  ? Other  ? GAD (generalized anxiety disorder)  ?  Chronic and well controlled ?Continue zoloft 50mg  daily ?Taking xanax very infrequently  ?Last refill >2 years ago ?  ?  ? Relevant Medications  ? sertraline (ZOLOFT) 50 MG tablet  ? Mixed hyperlipidemia  ?  Reviewed last lipid panel ?Not currently on a statin ?Recheck FLP and CMP ?Discussed diet and exercise  ?  ?  ? Relevant Orders  ? Comprehensive metabolic panel  ? Lipid panel  ?  ? ?Return in about 6 months (around 09/05/2022).  ?   ? ?I, , MD, have reviewed all documentation for this visit. The documentation on 03/05/22 for the exam, diagnosis, procedures, and orders are all accurate and complete. ? ? ?Shirlee Latch, MD, MPH ?[facility] ?[facility]   ?

## 2022-03-05 ENCOUNTER — Ambulatory Visit (INDEPENDENT_AMBULATORY_CARE_PROVIDER_SITE_OTHER): Payer: BC Managed Care – PPO | Admitting: Family Medicine

## 2022-03-05 ENCOUNTER — Other Ambulatory Visit: Payer: Self-pay

## 2022-03-05 ENCOUNTER — Encounter: Payer: Self-pay | Admitting: Family Medicine

## 2022-03-05 VITALS — BP 106/72 | HR 73 | Temp 97.5°F | Resp 16 | Ht 67.0 in | Wt 158.0 lb

## 2022-03-05 DIAGNOSIS — E782 Mixed hyperlipidemia: Secondary | ICD-10-CM | POA: Insufficient documentation

## 2022-03-05 DIAGNOSIS — E039 Hypothyroidism, unspecified: Secondary | ICD-10-CM | POA: Diagnosis not present

## 2022-03-05 DIAGNOSIS — F411 Generalized anxiety disorder: Secondary | ICD-10-CM | POA: Diagnosis not present

## 2022-03-05 MED ORDER — SERTRALINE HCL 50 MG PO TABS: 50.0000 mg | ORAL_TABLET | Freq: Every day | ORAL | 3 refills | Status: DC

## 2022-03-05 MED ORDER — LEVOTHYROXINE SODIUM 137 MCG PO TABS: 137.0000 ug | ORAL_TABLET | Freq: Every day | ORAL | 3 refills | Status: DC

## 2022-03-05 NOTE — Assessment & Plan Note (Signed)
Chronic and well controlled ?Continue zoloft 50mg  daily ?Taking xanax very infrequently  ?Last refill >2 years ago ?

## 2022-03-05 NOTE — Assessment & Plan Note (Signed)
Reviewed last lipid panel Not currently on a statin Recheck FLP and CMP Discussed diet and exercise  

## 2022-03-05 NOTE — Assessment & Plan Note (Signed)
Previously well controlled Continue Synthroid at current dose  Recheck TSH and adjust Synthroid as indicated   

## 2022-03-09 DIAGNOSIS — E782 Mixed hyperlipidemia: Secondary | ICD-10-CM | POA: Diagnosis not present

## 2022-03-09 DIAGNOSIS — E039 Hypothyroidism, unspecified: Secondary | ICD-10-CM | POA: Diagnosis not present

## 2022-03-10 LAB — COMPREHENSIVE METABOLIC PANEL
ALT: 12 IU/L (ref 0–32)
AST: 13 IU/L (ref 0–40)
Albumin/Globulin Ratio: 2.4 — ABNORMAL HIGH (ref 1.2–2.2)
Albumin: 4.5 g/dL (ref 3.8–4.9)
Alkaline Phosphatase: 55 IU/L (ref 44–121)
BUN/Creatinine Ratio: 30 — ABNORMAL HIGH (ref 9–23)
BUN: 17 mg/dL (ref 6–24)
Bilirubin Total: 0.6 mg/dL (ref 0.0–1.2)
CO2: 26 mmol/L (ref 20–29)
Calcium: 9.3 mg/dL (ref 8.7–10.2)
Chloride: 101 mmol/L (ref 96–106)
Creatinine, Ser: 0.57 mg/dL (ref 0.57–1.00)
Globulin, Total: 1.9 g/dL (ref 1.5–4.5)
Glucose: 93 mg/dL (ref 70–99)
Potassium: 4.3 mmol/L (ref 3.5–5.2)
Sodium: 140 mmol/L (ref 134–144)
Total Protein: 6.4 g/dL (ref 6.0–8.5)
eGFR: 107 mL/min/{1.73_m2} (ref >59)

## 2022-03-10 LAB — LIPID PANEL
Chol/HDL Ratio: 4.5 ratio — ABNORMAL HIGH (ref 0.0–4.4)
Cholesterol, Total: 257 mg/dL — ABNORMAL HIGH (ref 100–199)
HDL: 57 mg/dL (ref >39)
LDL Chol Calc (NIH): 186 mg/dL — ABNORMAL HIGH (ref 0–99)
Triglycerides: 85 mg/dL (ref 0–149)
VLDL Cholesterol Cal: 14 mg/dL (ref 5–40)

## 2022-03-10 LAB — TSH: TSH: 1.23 u[IU]/mL (ref 0.450–4.500)

## 2022-03-20 ENCOUNTER — Encounter: Payer: Self-pay | Admitting: Family Medicine

## 2022-06-05 DIAGNOSIS — Z01419 Encounter for gynecological examination (general) (routine) without abnormal findings: Secondary | ICD-10-CM | POA: Diagnosis not present

## 2022-06-05 DIAGNOSIS — Z1231 Encounter for screening mammogram for malignant neoplasm of breast: Secondary | ICD-10-CM | POA: Diagnosis not present

## 2022-06-15 ENCOUNTER — Other Ambulatory Visit: Payer: Self-pay | Admitting: Obstetrics and Gynecology

## 2022-06-15 ENCOUNTER — Other Ambulatory Visit: Payer: Self-pay | Admitting: Family Medicine

## 2022-06-15 DIAGNOSIS — Z1231 Encounter for screening mammogram for malignant neoplasm of breast: Secondary | ICD-10-CM

## 2022-07-17 ENCOUNTER — Ambulatory Visit
Admission: RE | Admit: 2022-07-17 | Discharge: 2022-07-17 | Disposition: A | Discharge status: Home / Self Care | Payer: BC Managed Care – PPO | Source: Ambulatory Visit | Attending: Obstetrics and Gynecology | Admitting: Obstetrics and Gynecology

## 2022-07-17 DIAGNOSIS — Z1231 Encounter for screening mammogram for malignant neoplasm of breast: Secondary | ICD-10-CM | POA: Insufficient documentation

## 2022-07-18 ENCOUNTER — Encounter: Payer: Self-pay | Admitting: Family Medicine

## 2022-07-18 NOTE — Telephone Encounter (Signed)
Thank you for your message.  This problem warrants an office visit to discuss and treat appropriately with your provider.  Please give our office a call at [phone] to schedule an appointment.  Take care, [facility] staff 

## 2022-07-23 NOTE — Progress Notes (Unsigned)
Established patient visit  I,[name],acting as a scribe for Laurie Sprout, FNP.,have documented all relevant documentation on the behalf of Laurie Sprout, FNP,as directed by  Laurie Sprout, FNP while in the presence of Laurie Sprout, FNP.   Patient: Laurie Peterson   DOB: [birth year]   56 y.o. Female  MRN: [id number] Visit Date: 07/24/2022  Today's healthcare provider: [name], FNP  Introduced to nurse practitioner role and practice setting.  All questions answered.  Discussed provider/patient relationship and expectations.   Chief Complaint  Patient presents with   Nausea   Subjective    HPI  Subjective:     Laurie Peterson is a 56 y.o. female who presents for evaluation of nausea. Onset of symptoms was a week ago. Patient describes nausea as moderate. Symptoms have been associated with  fried food . Patient denies alcohol overuse, fever, hematemesis, melena, and possibility of pregnancy. Symptoms have stabilized. Evaluation to date has been none. Treatment to date has been none.    Medications: Outpatient Medications Prior to Visit  Medication Sig   levothyroxine (SYNTHROID) 137 MCG tablet Take 1 tablet (137 mcg total) by mouth daily before breakfast.   sertraline (ZOLOFT) 50 MG tablet Take 1 tablet (50 mg total) by mouth daily.   ALPRAZolam (XANAX) 0.5 MG tablet Take 0.5-1 tablets (0.25-0.5 mg total) by mouth at bedtime as needed for anxiety. (Patient not taking: Reported on 07/24/2022)   No facility-administered medications prior to visit.    Review of Systems  Last CBC Lab Results  Component Value Date   WBC 6.4 01/07/2019   HGB 13.7 01/07/2019   HCT 42 01/07/2019   PLT 267 70/12/7492   Last metabolic panel Lab Results  Component Value Date   GLUCOSE 93 03/09/2022   NA 140 03/09/2022   K 4.3 03/09/2022   CL 101 03/09/2022   CO2 26 03/09/2022   BUN 17 03/09/2022   CREATININE 0.57 03/09/2022   EGFR 107 03/09/2022   CALCIUM 9.3 03/09/2022   PROT 6.4  03/09/2022   ALBUMIN 4.5 03/09/2022   LABGLOB 1.9 03/09/2022   AGRATIO 2.4 (H) 03/09/2022   BILITOT 0.6 03/09/2022   ALKPHOS 55 03/09/2022   AST 13 03/09/2022   ALT 12 03/09/2022   Last lipids Lab Results  Component Value Date   CHOL 257 (H) 03/09/2022   HDL 57 03/09/2022   LDLCALC 186 (H) 03/09/2022   TRIG 85 03/09/2022   CHOLHDL 4.5 (H) 03/09/2022   Last thyroid functions Lab Results  Component Value Date   TSH 1.230 03/09/2022   Last vitamin B12 and Folate Lab Results  Component Value Date   VITAMINB12 316 01/07/2019       Objective    BP 117/83 (BP Location: Right Arm, Patient Position: Sitting)   Pulse 80   Resp 16   Wt 155 lb (70.3 kg)   SpO2 99%   BMI 24.28 kg/m   BP Readings from Last 3 Encounters:  07/24/22 117/83  03/05/22 106/72  07/20/21 104/76   Wt Readings from Last 3 Encounters:  07/24/22 155 lb (70.3 kg)  03/05/22 158 lb (71.7 kg)  07/20/21 162 lb 9.6 oz (73.8 kg)   SpO2 Readings from Last 3 Encounters:  07/24/22 99%  07/20/21 99%  11/29/20 98%      Physical Exam Vitals and nursing note reviewed.  Constitutional:      General: She is not in acute distress.    Appearance:  Normal appearance. She is normal weight. She is not ill-appearing, toxic-appearing or diaphoretic.  HENT:     Head: Normocephalic and atraumatic.  Cardiovascular:     Rate and Rhythm: Normal rate and regular rhythm.     Pulses: Normal pulses.     Heart sounds: Normal heart sounds. No murmur heard.    No friction rub. No gallop.  Pulmonary:     Effort: Pulmonary effort is normal. No respiratory distress.     Breath sounds: Normal breath sounds. No stridor. No wheezing, rhonchi or rales.  Chest:     Chest wall: No tenderness.  Abdominal:     General: Bowel sounds are normal. There is no distension.     Palpations: Abdomen is soft. There is no mass.     Tenderness: There is no abdominal tenderness. There is no guarding or rebound.     Hernia: No hernia is  present.  Musculoskeletal:        General: No swelling, tenderness, deformity or signs of injury. Normal range of motion.     Right lower leg: No edema.     Left lower leg: No edema.  Skin:    General: Skin is warm and dry.     Capillary Refill: Capillary refill takes less than 2 seconds.     Coloration: Skin is not jaundiced or pale.     Findings: No bruising, erythema, lesion or rash.  Neurological:     General: No focal deficit present.     Mental Status: She is alert and oriented to person, place, and time. Mental status is at baseline.     Cranial Nerves: No cranial nerve deficit.     Sensory: No sensory deficit.     Motor: No weakness.     Coordination: Coordination normal.  Psychiatric:        Mood and Affect: Mood normal.        Behavior: Behavior normal.        Thought Content: Thought content normal.        Judgment: Judgment normal.     No results found for any visits on 07/24/22.  Assessment & Plan     Problem List Items Addressed This Visit       Other   Mixed hyperlipidemia    The 10-year ASCVD risk score (Arnett DK, et al., 2019) is: 2.1%   Values used to calculate the score:     Age: 28 years     Sex: Female     Is Non-Hispanic African American: No     Diabetic: No     Tobacco smoker: No     Systolic Blood Pressure: 124 mmHg     Is BP treated: No     HDL Cholesterol: 57 mg/dL     Total Cholesterol: 257 mg/dL Heart attack and stroke risk is 2% estimated within the next 10 years which is low.   Repeat LP given previous elevation without use of cholesterol medication I recommend diet low in saturated fat and regular exercise - 30 min at least 5 times per week        Relevant Orders   Lipid panel   Nausea - Primary    Concern for nausea, worse after meals. Did not try anything to assist. recommend H pylori breath test; unable to comply d/t gag reflex H Pylori blood work substituted  Symptoms have resolved at this point- present last week  following return from time out of town camping in [place], [place]  Right upper quadrant abdominal pain    Hx of RUQ pain; normal exam Recommend [place] of liver/gallbladder Recommend LFTs/GGT      Relevant Orders   H. pylori breath test   US Abdomen Limited RUQ (LIVER/GB)   H Pylori, IGM, IGG, IGA AB   Hepatic function panel   Gamma GT     Return if symptoms worsen or fail to improve. Patient will be notified via phone to schedule RUQ Ultrasound; unable to perform today d/t breakfast.     I, Laurie Sprout, FNP, have reviewed all documentation for this visit. The documentation on 07/24/22 for the exam, diagnosis, procedures, and orders are all accurate and complete.    Laurie Peterson, [place] [phone] (phone) [phone] (fax)  [place]

## 2022-07-24 ENCOUNTER — Ambulatory Visit (INDEPENDENT_AMBULATORY_CARE_PROVIDER_SITE_OTHER): Payer: BC Managed Care – PPO | Admitting: Family Medicine

## 2022-07-24 ENCOUNTER — Encounter: Payer: Self-pay | Admitting: Family Medicine

## 2022-07-24 VITALS — BP 117/83 | HR 80 | Resp 16 | Wt 155.0 lb

## 2022-07-24 DIAGNOSIS — R1011 Right upper quadrant pain: Secondary | ICD-10-CM | POA: Diagnosis not present

## 2022-07-24 DIAGNOSIS — R110 Nausea: Secondary | ICD-10-CM | POA: Diagnosis not present

## 2022-07-24 DIAGNOSIS — E782 Mixed hyperlipidemia: Secondary | ICD-10-CM | POA: Diagnosis not present

## 2022-07-24 NOTE — Assessment & Plan Note (Signed)
The 10-year ASCVD risk score (Arnett DK, et al., 2019) is: 2.1%   Values used to calculate the score:     Age: 56 years     Sex: Female     Is Non-Hispanic African American: No     Diabetic: No     Tobacco smoker: No     Systolic Blood Pressure: 117 mmHg     Is BP treated: No     HDL Cholesterol: 57 mg/dL     Total Cholesterol: 257 mg/dL Heart attack and stroke risk is 2% estimated within the next 10 years which is low.   Repeat LP given previous elevation without use of cholesterol medication I recommend diet low in saturated fat and regular exercise - 30 min at least 5 times per week

## 2022-07-24 NOTE — Assessment & Plan Note (Signed)
Concern for nausea, worse after meals. Did not try anything to assist. recommend H pylori breath test; unable to comply d/t gag reflex H Pylori blood work substituted  Symptoms have resolved at this point- present last week following return from time out of town camping in [place], [place]

## 2022-07-24 NOTE — Assessment & Plan Note (Signed)
Hx of RUQ pain; normal exam Recommend [place] of liver/gallbladder Recommend LFTs/GGT

## 2022-07-25 NOTE — Progress Notes (Signed)
Normal liver function and enzymes.  Cholesterol improved; however, bad/LDL is elevated and good/HDL is low. The 10-year ASCVD risk score (Arnett DK, et al., 2019) is: 2.3%   Values used to calculate the score:     Age: 56 years     Sex: Female     Is Non-Hispanic African American: No     Diabetic: No     Tobacco smoker: No     Systolic Blood Pressure: 117 mmHg     Is BP treated: No     HDL Cholesterol: 36 mg/dL     Total Cholesterol: 185 mg/dL Heart attack and stroke risk is 2.3% estimated within the next 10 years which is low. I recommend diet low in saturated fat and regular exercise - 30 min at least 5 times per week  Please let us know if you have any questions.  Thank you, Jacky Kindle, FNP  [facility] [address] [place], [place] [address] [phone] (phone) [phone] (fax) [facility]

## 2022-07-27 ENCOUNTER — Ambulatory Visit
Admission: RE | Admit: 2022-07-27 | Discharge: 2022-07-27 | Disposition: A | Discharge status: Home / Self Care | Payer: BC Managed Care – PPO | Source: Ambulatory Visit | Attending: Family Medicine | Admitting: Family Medicine

## 2022-07-27 DIAGNOSIS — R109 Unspecified abdominal pain: Secondary | ICD-10-CM | POA: Diagnosis not present

## 2022-07-27 DIAGNOSIS — R1011 Right upper quadrant pain: Secondary | ICD-10-CM | POA: Diagnosis not present

## 2022-07-27 LAB — H PYLORI, IGM, IGG, IGA AB
H pylori, IgM Abs: <9 units (ref 0.0–8.9)
H. pylori, IgA Abs: <9 units (ref 0.0–8.9)
H. pylori, IgG AbS: 0.3 Index Value (ref 0.00–0.79)

## 2022-07-27 LAB — HEPATIC FUNCTION PANEL
ALT: 12 IU/L (ref 0–32)
AST: 8 IU/L (ref 0–40)
Albumin: 4.7 g/dL (ref 3.8–4.9)
Alkaline Phosphatase: 52 IU/L (ref 44–121)
Bilirubin Total: 0.6 mg/dL (ref 0.0–1.2)
Bilirubin, Direct: 0.13 mg/dL (ref 0.00–0.40)
Total Protein: 6.5 g/dL (ref 6.0–8.5)

## 2022-07-27 LAB — LIPID PANEL
Chol/HDL Ratio: 5.1 ratio — ABNORMAL HIGH (ref 0.0–4.4)
Cholesterol, Total: 185 mg/dL (ref 100–199)
HDL: 36 mg/dL — ABNORMAL LOW (ref >39)
LDL Chol Calc (NIH): 132 mg/dL — ABNORMAL HIGH (ref 0–99)
Triglycerides: 91 mg/dL (ref 0–149)
VLDL Cholesterol Cal: 17 mg/dL (ref 5–40)

## 2022-07-27 LAB — GAMMA GT: GGT: 6 IU/L (ref 0–60)

## 2022-07-27 NOTE — Progress Notes (Signed)
Small gallbladder polyps seen; no further care needed given small size.  Jacky Kindle, FNP  [facility] [address] [place], [place] [address] [phone] (phone) [phone] (fax) [facility]

## 2023-01-09 ENCOUNTER — Other Ambulatory Visit: Payer: Self-pay | Admitting: Family Medicine

## 2023-01-10 NOTE — Telephone Encounter (Signed)
Unable to refill per protocol, Rx request is too soon. Last refill 03/05/22 for 90 and 3 refills.  Requested Prescriptions  Pending Prescriptions Disp Refills   sertraline (ZOLOFT) 50 MG tablet [Pharmacy Med Name: Sertraline HCl 50 MG Oral Tablet] 90 tablet 3    Sig: TAKE 1 TABLET BY MOUTH DAILY     Psychiatry:  Antidepressants - SSRI - sertraline Passed - 01/09/2023 11:39 PM      Passed - AST in normal range and within 360 days    AST  Date Value Ref Range Status  07/24/2022 8 0 - 40 IU/L Final         Passed - ALT in normal range and within 360 days    ALT  Date Value Ref Range Status  07/24/2022 12 0 - 32 IU/L Final         Passed - Completed PHQ-2 or PHQ-9 in the last 360 days      Passed - Valid encounter within last 6 months    Recent Outpatient Visits           5 months ago Nausea   [facility] [name], FNP   10 months ago Adult hypothyroidism   [facility], Dionne Bucy, MD   1 year ago Bacterial conjunctivitis of both eyes   [facility], Dionne Bucy, MD   1 year ago Postprandial RUQ pain   [facility] [name], FNP   2 years ago Annual physical exam   [facility], Dionne Bucy, MD

## 2023-02-01 ENCOUNTER — Other Ambulatory Visit: Payer: Self-pay | Admitting: Family Medicine

## 2023-02-04 NOTE — Telephone Encounter (Signed)
Unable to refill per protocol, Rx request is too soon. Last refill 3/20/823 for 90 days and 3 refills. Next refill in March.  Requested Prescriptions  Pending Prescriptions Disp Refills   levothyroxine (SYNTHROID) 137 MCG tablet [Pharmacy Med Name: Levothyroxine Sodium 137 MCG Oral Tablet] 90 tablet 3    Sig: TAKE 1 TABLET BY MOUTH DAILY  BEFORE BREAKFAST     Endocrinology:  Hypothyroid Agents Passed - 02/01/2023 10:09 PM      Passed - TSH in normal range and within 360 days    TSH  Date Value Ref Range Status  03/09/2022 1.230 0.450 - 4.500 uIU/mL Final         Passed - Valid encounter within last 12 months    Recent Outpatient Visits           6 months ago Nausea   [place] [name], FNP   11 months ago Adult hypothyroidism   [facility], Dionne Bucy, MD   1 year ago Bacterial conjunctivitis of both eyes   [facility] [place], Dionne Bucy, MD   1 year ago Postprandial RUQ pain   [place] [facility] T, FNP   2 years ago Annual physical exam   [facility], Dionne Bucy, MD

## 2023-02-08 ENCOUNTER — Other Ambulatory Visit: Payer: Self-pay | Admitting: Family Medicine

## 2023-03-05 ENCOUNTER — Encounter: Payer: Self-pay | Admitting: Family Medicine

## 2023-03-07 MED ORDER — SERTRALINE HCL 50 MG PO TABS: 50.0000 mg | ORAL_TABLET | Freq: Every day | ORAL | 0 refills | Status: DC

## 2023-05-02 ENCOUNTER — Other Ambulatory Visit: Payer: Self-pay | Admitting: Family Medicine

## 2023-05-02 NOTE — Telephone Encounter (Signed)
Unable to refill per protocol, Rx request is too soon last refill 03/07/23 for 90 days. Patient needs OV for additional refills.  Requested Prescriptions  Pending Prescriptions Disp Refills   sertraline (ZOLOFT) 50 MG tablet [Pharmacy Med Name: Sertraline HCl 50 MG Oral Tablet] 90 tablet 3    Sig: TAKE 1 TABLET BY MOUTH DAILY     Psychiatry:  Antidepressants - SSRI - sertraline Failed - 05/02/2023  2:22 AM      Failed - Completed PHQ-2 or PHQ-9 in the last 360 days      Failed - Valid encounter within last 6 months    Recent Outpatient Visits           9 months ago Nausea   [place] [facility] Jacky Kindle, FNP   1 year ago Adult hypothyroidism   [place] [facility] [place], Marzella Schlein, MD   1 year ago Bacterial conjunctivitis of both eyes   [facility] [place], Marzella Schlein, MD   1 year ago Postprandial RUQ pain   [place] [facility] [place] T, FNP   2 years ago Annual physical exam   [facility], Marzella Schlein, MD              Passed - AST in normal range and within 360 days    AST  Date Value Ref Range Status  07/24/2022 8 0 - 40 IU/L Final         Passed - ALT in normal range and within 360 days    ALT  Date Value Ref Range Status  07/24/2022 12 0 - 32 IU/L Final

## 2023-06-16 ENCOUNTER — Encounter: Payer: Self-pay | Admitting: Family Medicine

## 2023-06-16 ENCOUNTER — Other Ambulatory Visit: Payer: Self-pay | Admitting: Family Medicine

## 2023-06-17 ENCOUNTER — Other Ambulatory Visit: Payer: Self-pay

## 2023-06-17 MED ORDER — SERTRALINE HCL 50 MG PO TABS: 50.0000 mg | ORAL_TABLET | Freq: Every day | ORAL | 0 refills | Status: DC

## 2023-06-25 ENCOUNTER — Other Ambulatory Visit: Payer: Self-pay | Admitting: Family Medicine

## 2023-06-25 ENCOUNTER — Encounter: Payer: Self-pay | Admitting: Family Medicine

## 2023-07-08 ENCOUNTER — Encounter: Payer: Self-pay | Admitting: Family Medicine

## 2023-07-08 ENCOUNTER — Ambulatory Visit (INDEPENDENT_AMBULATORY_CARE_PROVIDER_SITE_OTHER): Payer: BC Managed Care – PPO | Admitting: Family Medicine

## 2023-07-08 VITALS — BP 106/70 | HR 72 | Temp 97.9°F | Resp 13 | Ht 67.0 in | Wt 157.1 lb

## 2023-07-08 DIAGNOSIS — E039 Hypothyroidism, unspecified: Secondary | ICD-10-CM | POA: Diagnosis not present

## 2023-07-08 DIAGNOSIS — E782 Mixed hyperlipidemia: Secondary | ICD-10-CM | POA: Diagnosis not present

## 2023-07-08 DIAGNOSIS — F411 Generalized anxiety disorder: Secondary | ICD-10-CM | POA: Diagnosis not present

## 2023-07-08 MED ORDER — SERTRALINE HCL 50 MG PO TABS
50.0000 mg | ORAL_TABLET | Freq: Every day | ORAL | 3 refills | Status: DC
Start: 2023-07-08 — End: 2024-04-09

## 2023-07-08 MED ORDER — LEVOTHYROXINE SODIUM 137 MCG PO TABS: 137.0000 ug | ORAL_TABLET | Freq: Every day | ORAL | 3 refills | Status: DC

## 2023-07-08 MED ORDER — SERTRALINE HCL 50 MG PO TABS: 50.0000 mg | ORAL_TABLET | Freq: Every day | ORAL | 3 refills | Status: DC

## 2023-07-08 MED ORDER — LEVOTHYROXINE SODIUM 137 MCG PO TABS
137.0000 ug | ORAL_TABLET | Freq: Every day | ORAL | 3 refills | Status: DC
Start: 2023-07-08 — End: 2024-07-22

## 2023-07-08 NOTE — Assessment & Plan Note (Signed)
Stable. Patient is not having any symptoms of hyperthyroidism or hypothyroidism. Last TSH was WNL.  Continue Synthroid

## 2023-07-08 NOTE — Assessment & Plan Note (Addendum)
Stable. She is happy with her current dose of Sertraline. She does not currently take Xanax but would like to keep it prn. Continue Sertraline

## 2023-07-08 NOTE — Progress Notes (Signed)
   Established Patient Office Visit  Subjective   Patient ID: Laurie Peterson, female    DOB: 05/19/1966  Age: 57 y.o. MRN: [id number]  Chief Complaint  Patient presents with   Medication Refill    Laurie Peterson is here today for a medication refill. She has been taking Sertraline and Synthroid for years. She is happy with her current dose and is not having any side effects.  Overall she feels she is doing well and would just like a medication refill.  She does not take Xanax regularly and has not taken it for years. She gets really bad anxiety with travel and would like to have the option to get it refilled if she ever needed it. She does not need a refill of it at the moment.    Patient Active Problem List   Diagnosis Date Noted   Nausea 07/24/2022   Right upper quadrant abdominal pain 07/24/2022   Mixed hyperlipidemia 03/05/2022   GAD (generalized anxiety disorder) 01/23/2016   Adult hypothyroidism 01/23/2016      Review of Systems  All other systems reviewed and are negative.     Objective:     There were no vitals taken for this visit.   Physical Exam Constitutional:      Appearance: Normal appearance.  HENT:     Mouth/Throat:     Pharynx: Oropharynx is clear.  Skin:    General: Skin is warm and dry.  Neurological:     General: No focal deficit present.     Mental Status: She is alert and oriented to person, place, and time.    No results found for any visits on 07/08/23.  Last thyroid functions Lab Results  Component Value Date   TSH 1.230 03/09/2022      The 10-year ASCVD risk score (Arnett DK, et al., 2019) is: 2.5%    Assessment & Plan:   Problem List Items Addressed This Visit       Endocrine   Adult hypothyroidism - Primary    Stable. Patient is not having any symptoms of hyperthyroidism or hypothyroidism. Last TSH was WNL.  Continue Synthroid       Relevant Medications   levothyroxine (SYNTHROID) 137 MCG tablet   Other Relevant Orders   TSH      Other   GAD (generalized anxiety disorder)    Stable. She is happy with her current dose of Sertraline. She does not currently take Xanax but would like to keep it prn. Continue Sertraline       Relevant Medications   sertraline (ZOLOFT) 50 MG tablet   Mixed hyperlipidemia    Stable. Last lipid profile was checked in 2023. There was an elevated LDL and decreased HDL. She endorses doing the keto diet then switching to a mediterranean diet which she thinks messed with her lipid levels. Recheck lipid panel      Relevant Orders   Lipid panel   Comprehensive metabolic panel    Return in about 1 year (around 07/07/2024) for CPE.    Rometta Emery, Medical Student  Patient seen along with MS3 student Jodi Marble. I personally evaluated this patient along with the student, and verified all aspects of the history, physical exam, and medical decision making as documented by the student. I agree with the student's documentation and have made all necessary edits.  Anyely Cunning, Marzella Schlein, MD, MPH [facility]

## 2023-07-08 NOTE — Assessment & Plan Note (Signed)
Stable. Last lipid profile was checked in 2023. There was an elevated LDL and decreased HDL. She endorses doing the keto diet then switching to a mediterranean diet which she thinks messed with her lipid levels. Recheck lipid panel

## 2023-07-09 LAB — LIPID PANEL
Chol/HDL Ratio: 4.8 ratio — ABNORMAL HIGH (ref 0.0–4.4)
Cholesterol, Total: 278 mg/dL — ABNORMAL HIGH (ref 100–199)
HDL: 58 mg/dL (ref >39)
LDL Chol Calc (NIH): 199 mg/dL — ABNORMAL HIGH (ref 0–99)
Triglycerides: 116 mg/dL (ref 0–149)
VLDL Cholesterol Cal: 21 mg/dL (ref 5–40)

## 2023-07-09 LAB — COMPREHENSIVE METABOLIC PANEL
ALT: 11 IU/L (ref 0–32)
AST: 11 IU/L (ref 0–40)
Albumin: 4.2 g/dL (ref 3.8–4.9)
Alkaline Phosphatase: 59 IU/L (ref 44–121)
BUN/Creatinine Ratio: 27 — ABNORMAL HIGH (ref 9–23)
BUN: 20 mg/dL (ref 6–24)
Bilirubin Total: 0.4 mg/dL (ref 0.0–1.2)
CO2: 22 mmol/L (ref 20–29)
Calcium: 9.1 mg/dL (ref 8.7–10.2)
Chloride: 100 mmol/L (ref 96–106)
Creatinine, Ser: 0.75 mg/dL (ref 0.57–1.00)
Globulin, Total: 2.1 g/dL (ref 1.5–4.5)
Glucose: 93 mg/dL (ref 70–99)
Potassium: 4.2 mmol/L (ref 3.5–5.2)
Sodium: 138 mmol/L (ref 134–144)
Total Protein: 6.3 g/dL (ref 6.0–8.5)
eGFR: 93 mL/min/{1.73_m2} (ref >59)

## 2023-07-09 LAB — TSH: TSH: 0.667 u[IU]/mL (ref 0.450–4.500)

## 2023-07-10 ENCOUNTER — Encounter: Payer: Self-pay | Admitting: Family Medicine

## 2023-07-12 ENCOUNTER — Encounter: Payer: BC Managed Care – PPO | Admitting: Physician Assistant

## 2023-07-12 ENCOUNTER — Other Ambulatory Visit: Payer: Self-pay | Admitting: Family Medicine

## 2023-07-12 ENCOUNTER — Ambulatory Visit: Payer: Self-pay | Admitting: *Deleted

## 2023-07-12 MED ORDER — ALPRAZOLAM 0.5 MG PO TABS
0.2500 mg | ORAL_TABLET | Freq: Every evening | ORAL | 0 refills | Status: AC | PRN
Start: 2023-07-12 — End: ?

## 2023-07-12 NOTE — Progress Notes (Deleted)
  Established patient visit  Patient: Laurie Peterson   DOB: [birth year]   57 y.o. Female  MRN: [id number] Visit Date: 07/12/2023  Today's healthcare provider: Debera Lat, PA-C   Chief Complaint  Patient presents with  . Acute Visit    Anxiety , going from awhile, she has dental anxiety she has an dental next week for her tooth and she very nervous about. Need a refill   . Error    Subjective    HPI HPI    Acute Visit    Additional comments: Anxiety , going from awhile, she has dental anxiety she has an dental next week for her tooth and she very nervous about. Need a refill       Last edited by Debera Lat, PA-C on 07/23/2023  9:47 AM.      *** Discussed the use of AI scribe software for clinical note transcription with the patient, who gave verbal consent to proceed.  History of Present Illness               07/08/2023    3:57 PM 03/05/2022    3:17 PM 11/29/2020    1:24 PM  Depression screen PHQ 2/9  Decreased Interest 0 0 0  Down, Depressed, Hopeless 0 0 0  PHQ - 2 Score 0 0 0  Altered sleeping   0  Tired, decreased energy   0  Change in appetite   0  Feeling bad or failure about yourself    0  Trouble concentrating   0  Moving slowly or fidgety/restless   0  Suicidal thoughts   0  PHQ-9 Score   0  Difficult doing work/chores   Not difficult at all      03/05/2022    3:22 PM 01/06/2020    1:34 PM 03/30/2019    8:23 AM  GAD 7 : Generalized Anxiety Score  Nervous, Anxious, on Edge 0 0 1  Control/stop worrying 0 0 0  Worry too much - different things 0 1 0  Trouble relaxing 0 1 0  Restless 0 1 1  Easily annoyed or irritable 0 0 0  Afraid - awful might happen 0 0 1  Total GAD 7 Score 0 3 3  Anxiety Difficulty Not difficult at all Not difficult at all Not difficult at all    Medications: Outpatient Medications Prior to Visit  Medication Sig  . ALPRAZolam (XANAX) 0.5 MG tablet Take 0.5-1 tablets (0.25-0.5 mg total) by mouth at bedtime as needed for  anxiety.  [place] Kitchen levothyroxine (SYNTHROID) 137 MCG tablet Take 1 tablet (137 mcg total) by mouth daily before breakfast.  . sertraline (ZOLOFT) 50 MG tablet Take 1 tablet (50 mg total) by mouth daily.   No facility-administered medications prior to visit.    Review of Systems Except see HPI   {Insert previous labs (optional):23779}  {See past labs  Heme  Chem  Endocrine  Serology  Results Review (optional):1}   Objective    There were no vitals taken for this visit. {Insert last BP/Wt (optional):23777}  {See vitals history (optional):1}  Physical Exam   No results found for any visits on 07/12/23.  Assessment & Plan    *** Assessment and Plan              No follow-ups on file.      [facility] This encounter was created in error - please disregard.

## 2023-07-12 NOTE — Telephone Encounter (Signed)
  Chief Complaint: panic attacks due to pain in tooth. Requesting if expired medication is still ok to take. Symptoms: panic attacks due to pain in tooth more often and becomes angry with elevated heart rate. Requesting if she can still take her expired xanax ? Frequency: "the other day" Pertinent Negatives: Patient denies chest pain no difficulty breathing no anxiety now  Disposition: [] ED /[] Urgent Care (no appt availability in office) / [x] Appointment(In office/virtual)/ []  [facility]/ [] Home Care/ [] Refused Recommended Disposition /[] [facility]/ []  Follow-up with PCP Additional Notes:   My chart VV scheduled for today with a provider . Recommended patient  contact pharmacist as well .  Please advise if xanax can be reordered without appt.    Reason for Disposition  Panic attacks are increasing in frequency  Answer Assessment - Initial Assessment Questions 1. CONCERN: "Did anything happen that prompted you to call today?"      Tooth bothering her and has initial panic hard to control it  2. ANXIETY SYMPTOMS: "Can you describe how you (your loved one; patient) have been feeling?" (e.g., tense, restless, panicky, anxious, keyed up, overwhelmed, sense of impending doom).      Anxious and became angry when tooth pain noted. Surge of adrenaline  3. ONSET: "How long have you been feeling this way?" (e.g., hours, days, weeks)     Had the other day  4. SEVERITY: "How would you rate the level of anxiety?" (e.g., 0 - 10; or mild, moderate, severe).     Mild  5. FUNCTIONAL IMPAIRMENT: "How have these feelings affected your ability to do daily activities?" "Have you had more difficulty than usual doing your normal daily activities?" (e.g., getting better, same, worse; self-care, school, work, interactions)     Na  6. HISTORY: "Have you felt this way before?" "Have you ever been diagnosed with an anxiety problem in the past?" (e.g., generalized anxiety disorder, panic  attacks, PTSD). If Yes, ask: "How was this problem treated?" (e.g., medicines, counseling, etc.)     Yes reports she has techniques that help with panic attacks  7. RISK OF HARM - SUICIDAL IDEATION: "Do you ever have thoughts of hurting or killing yourself?" If Yes, ask:  "Do you have these feelings now?" "Do you have a plan on how you would do this?"     no 8. TREATMENT:  "What has been done so far to treat this anxiety?" (e.g., medicines, relaxation strategies). "What has helped?"     Took old medication xanax 9. TREATMENT - THERAPIST: "Do you have a counselor or therapist? Name?"     na 10. POTENTIAL TRIGGERS: "Do you drink caffeinated beverages (e.g., coffee, colas, teas), and how much daily?" "Do you drink alcohol or use any drugs?" "Have you started any new medicines recently?"       na 11. PATIENT SUPPORT: "Who is with you now?" "Who do you live with?" "Do you have family or friends who you can talk to?"        Yes grandchild  70. OTHER SYMPTOMS: "Do you have any other symptoms?" (e.g., feeling depressed, trouble concentrating, trouble sleeping, trouble breathing, palpitations or fast heartbeat, chest pain, sweating, nausea, or diarrhea)       Feels heart rate elevated  when panic attack noted. None now   13. PREGNANCY: "Is there any chance you are pregnant?" "When was your last menstrual period?"       na  Protocols used: Anxiety and Panic Attack-A-AH

## 2023-07-23 NOTE — Progress Notes (Signed)
error 

## 2024-03-03 ENCOUNTER — Encounter: Payer: Self-pay | Admitting: Family Medicine

## 2024-03-03 ENCOUNTER — Ambulatory Visit (INDEPENDENT_AMBULATORY_CARE_PROVIDER_SITE_OTHER): Admitting: Family Medicine

## 2024-03-03 VITALS — BP 103/82 | HR 92 | Ht 67.0 in | Wt 156.5 lb

## 2024-03-03 DIAGNOSIS — R1030 Lower abdominal pain, unspecified: Secondary | ICD-10-CM | POA: Diagnosis not present

## 2024-03-03 DIAGNOSIS — R140 Abdominal distension (gaseous): Secondary | ICD-10-CM | POA: Diagnosis not present

## 2024-03-03 DIAGNOSIS — R197 Diarrhea, unspecified: Secondary | ICD-10-CM

## 2024-03-03 NOTE — Progress Notes (Signed)
 Acute visit   Patient: Laurie Peterson   DOB: [birth year]   58 y.o. Female  MRN: [id number] PCP: Erasmo Downer, MD   Chief Complaint  Patient presents with   Abdominal Pain    Abdominal pain associated with boating due to digestive issues starting back in November but was on and off. Has gradually gotten worse and has been more consistent in the last 3 weeks. Patient reports she takes tums, gas-x (only when severe) and pepto (rarely) but tums helps the most. Also reports using a probiotic.    Care Management    Zoster Vaccine - not today Cervical Cancer Screening - 2 years ago Laurie Peterson at [facility]   Subjective    Discussed the use of AI scribe software for clinical note transcription with the patient, who gave verbal consent to proceed.  History of Present Illness   The patient, with a history of a prolapsed uterus, presents with abdominal discomfort and bloating. She reports that these symptoms have been ongoing since December, and she has noticed an increase in abdominal size, leading to difficulty fitting into her jeans. The patient suspects that her symptoms are due to gluten sensitivity, as she has noticed a correlation between consuming gluten-containing foods and the onset of her symptoms. She describes an incident in November when she consumed a large amount of homemade Chex mix, which contains gluten, and experienced severe abdominal pain and diarrhea for five to six days. She also mentions a recent episode of consuming pizza and experiencing similar symptoms. The patient has previously followed a ketogenic diet, which is typically gluten-free, for three years and did not experience these symptoms during that time. She has attempted to eliminate gluten from her diet again, but notes that she has accidentally consumed gluten on a few occasions, leading to a flare-up of her symptoms.        Review of Systems  Objective    BP 103/82 (BP Location: Left Arm, Patient Position:  Sitting, Cuff Size: Normal)   Pulse 92   Ht 5\' 7"  (1.702 m)   Wt 156 lb 8 oz (71 kg)   SpO2 100%   BMI 24.51 kg/m  Physical Exam Vitals reviewed.  Constitutional:      General: She is not in acute distress.    Appearance: Normal appearance. She is well-developed. She is not diaphoretic.  HENT:     Head: Normocephalic and atraumatic.  Eyes:     General: No scleral icterus.    Conjunctiva/sclera: Conjunctivae normal.  Neck:     Thyroid: No thyromegaly.  Cardiovascular:     Rate and Rhythm: Normal rate and regular rhythm.     Pulses: Normal pulses.     Heart sounds: Normal heart sounds. No murmur heard. Pulmonary:     Effort: Pulmonary effort is normal. No respiratory distress.     Breath sounds: Normal breath sounds. No wheezing, rhonchi or rales.  Abdominal:     General: Bowel sounds are normal. There is distension (mild).     Palpations: Abdomen is soft. There is no shifting dullness.     Tenderness: There is no abdominal tenderness.  Musculoskeletal:     Cervical back: Neck supple.     Right lower leg: No edema.     Left lower leg: No edema.  Lymphadenopathy:     Cervical: No cervical adenopathy.  Skin:    General: Skin is warm and dry.  Neurological:     Mental Status: She is  alert and oriented to person, place, and time. Mental status is at baseline.  Psychiatric:        Mood and Affect: Mood normal.        Behavior: Behavior normal.       No results found for any visits on 03/03/24.  Assessment & Plan     Problem List Items Addressed This Visit   None Visit Diagnoses       Diarrhea, unspecified type    -  Primary   Relevant Orders   Celiac Disease Panel     Lower abdominal pain       Relevant Orders   Celiac Disease Panel     Abdominal bloating       Relevant Orders   Celiac Disease Panel           Gluten Sensitivity Experiences abdominal pain, bloating, and diarrhea after gluten consumption, including Chex mix and pizza. Symptoms have  persisted since November, with severe episodes lasting several days. Suspects gluten sensitivity or celiac disease, especially after reintroducing gluten post-keto diet. Discussed celiac disease and non-celiac gluten sensitivity. A blood test for celiac disease is possible, but non-celiac gluten sensitivity is diagnosed through elimination diet. Has been mostly gluten-free but consumed gluten recently to facilitate testing. If celiac test is negative, she prefers to avoid endoscopy and will try a gluten-free diet for a few months to assess symptom improvement. - Order celiac disease blood test - Recommend gluten-free diet regardless of test results - Consider referral to gastroenterology if symptoms persist  Uterine Prolapse Experiences discomfort and difficulty during pelvic exams due to uterine prolapse. Declines surgical intervention, stating the condition does not significantly bother her.  General Health Maintenance Has not had a Pap smear in a couple of years due to prolapse discomfort. Discussed scheduling a physical exam to monitor general health, including thyroid function. - Schedule physical exam in July for thyroid and general health monitoring       No orders of the defined types were placed in this encounter.    Return in about 4 months (around 07/03/2024) for chronic disease f/u.      Shirlee Latch, MD  [facility] [phone] (phone) [phone] (fax)  [facility]

## 2024-03-05 LAB — CELIAC DISEASE PANEL: IgA/Immunoglobulin A, Serum: 144 mg/dL (ref 87–352)

## 2024-03-17 ENCOUNTER — Encounter: Payer: Self-pay | Admitting: Physician Assistant

## 2024-04-07 ENCOUNTER — Ambulatory Visit (INDEPENDENT_AMBULATORY_CARE_PROVIDER_SITE_OTHER): Admitting: Family Medicine

## 2024-04-07 ENCOUNTER — Encounter: Payer: Self-pay | Admitting: Family Medicine

## 2024-04-07 VITALS — BP 125/85 | HR 100 | Resp 16 | Ht 67.0 in | Wt 156.8 lb

## 2024-04-07 DIAGNOSIS — R2242 Localized swelling, mass and lump, left lower limb: Secondary | ICD-10-CM

## 2024-04-07 NOTE — Patient Instructions (Addendum)
 Please review the attached list of medications and notify my office if there are any errors.   Go to [facility]) [facility] at [facility] for your Xrays (phone no. [phone])

## 2024-04-09 ENCOUNTER — Encounter: Payer: Self-pay | Admitting: Family Medicine

## 2024-04-09 DIAGNOSIS — F411 Generalized anxiety disorder: Secondary | ICD-10-CM

## 2024-04-09 MED ORDER — SERTRALINE HCL 100 MG PO TABS: 100.0000 mg | ORAL_TABLET | Freq: Every day | ORAL | 1 refills | Status: DC

## 2024-04-09 NOTE — Telephone Encounter (Signed)
 Rx has been sent. Pt advised and reports she will double her 50 mg until mail order of 100 mg tablets are received

## 2024-04-09 NOTE — Telephone Encounter (Signed)
 Ok to increase her zoloft to 100mg  daily #90 r1

## 2024-04-10 NOTE — Progress Notes (Signed)
      Established patient visit   Patient: Laurie Peterson   DOB: 08-24-1966   58 y.o. Female  MRN: [id number] Visit Date: 04/07/2024  Today's healthcare provider: Jeralene Mom, MD   Chief Complaint  Patient presents with   Cyst    Behind  left knee Frequency:x yesterday   Subjective    Discussed the use of AI scribe software for clinical note transcription with the patient, who gave verbal consent to proceed.  History of Present Illness   Laurie Peterson is a 58 year old female who presents with a lump behind her knee.  She noticed a hard lump on the back of her left knee, which feels different compared to her right knee. The lump does not cause pain and was first noticed while playing with her granddaughter on the floor. No swelling, redness, or tenderness in the area, although it can become sore with physical activity such as yard work.  She has a history of varicose veins in the affected leg, which sometimes ache and feel hot. 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  About a month ago, she experienced a sensation similar to a 'pulled knuckle' without any preceding physical activity, which she found unusual.       Medications: Outpatient Medications Prior to Visit  Medication Sig   ALPRAZolam  (XANAX ) 0.5 MG tablet Take 0.5-1 tablets (0.25-0.5 mg total) by mouth at bedtime as needed for anxiety.   levothyroxine  (SYNTHROID ) 137 MCG tablet Take 1 tablet (137 mcg total) by mouth daily before breakfast.   [DISCONTINUED] sertraline  (ZOLOFT ) 50 MG tablet Take 1 tablet (50 mg total) by mouth daily.   No facility-administered medications prior to visit.   Review of Systems     Objective    BP 125/85 (BP Location: Left Arm, Patient Position: Sitting, Cuff Size: Normal)   Pulse 100   Resp 16   Ht 5\' 7"  (1.702 m)   Wt 156 lb 12.8 oz (71.1 kg)   BMI 24.56 kg/m   Physical Exam    Vague non-tender grape sized cystic structure left distal popliteal fossae. Negative Homan's sign. Trace lower leg edema. No erythema.   Assessment & Plan        Lump behind left knee, suspect a Baker's cyst. Cannot rule out DVT on exam. - Order vascular ultrasound of the left knee rule out DVT and sure no other treatable pathology is present.   Varicose veins Varicose veins in left leg, sometimes ache and feel hot. Consider vascular referral in the future.    No follow-ups on file.      Jeralene Mom, MD  [facility] [phone] (phone) [phone] (fax)  [facility]

## 2024-04-15 ENCOUNTER — Encounter: Payer: Self-pay | Admitting: Family Medicine

## 2024-04-15 ENCOUNTER — Ambulatory Visit
Admission: RE | Admit: 2024-04-15 | Discharge: 2024-04-15 | Disposition: A | Discharge status: Home / Self Care | Source: Ambulatory Visit | Attending: Family Medicine | Admitting: Family Medicine

## 2024-04-15 DIAGNOSIS — M79605 Pain in left leg: Secondary | ICD-10-CM | POA: Diagnosis not present

## 2024-04-15 DIAGNOSIS — R2242 Localized swelling, mass and lump, left lower limb: Secondary | ICD-10-CM

## 2024-04-15 DIAGNOSIS — M7989 Other specified soft tissue disorders: Secondary | ICD-10-CM | POA: Diagnosis not present

## 2024-04-15 DIAGNOSIS — M7122 Synovial cyst of popliteal space [Baker], left knee: Secondary | ICD-10-CM | POA: Insufficient documentation

## 2024-05-04 ENCOUNTER — Encounter: Payer: Self-pay | Admitting: Family Medicine

## 2024-05-04 DIAGNOSIS — Z1231 Encounter for screening mammogram for malignant neoplasm of breast: Secondary | ICD-10-CM

## 2024-05-05 ENCOUNTER — Ambulatory Visit (INDEPENDENT_AMBULATORY_CARE_PROVIDER_SITE_OTHER): Admitting: Family Medicine

## 2024-05-05 ENCOUNTER — Encounter: Payer: Self-pay | Admitting: Family Medicine

## 2024-05-05 VITALS — BP 97/85 | HR 87 | Ht 67.0 in | Wt 153.0 lb

## 2024-05-05 DIAGNOSIS — R599 Enlarged lymph nodes, unspecified: Secondary | ICD-10-CM

## 2024-05-05 NOTE — Progress Notes (Signed)
 Established patient visit  Acute Visit    Patient: Laurie Peterson   DOB: 1966/11/15   58 y.o. Female  MRN: [id number] Visit Date: 05/05/2024  Today's healthcare provider: Mimi Alt, MD   Chief Complaint  Patient presents with   lymph    Swollen lymph node under her R arm, its not bothering her today, but has been advised to have it checked before she have a mammo done    Subjective     HPI     lymph    Additional comments: Swollen lymph node under her R arm, its not bothering her today, but has been advised to have it checked before she have a mammo done       Last edited by Bart Lieu, CMA on 05/05/2024 10:28 AM.       Discussed the use of AI scribe software for clinical note transcription with the patient, who gave verbal consent to proceed.  History of Present Illness Laurie Peterson is a 58 year old female who presents with an enlarged lymph node under her right arm.  She noticed a sore spot under her right arm while shaving, which she does daily, and has never experienced this issue before. The lymph node was described as 'tiny,' 'moving around,' and 'very sore,' but the soreness has since decreased. She checks it daily, noting that it is not draining or bleeding. She took Advil yesterday, which alleviated the soreness. She is concerned about the size of the lymph node, describing it as larger than it should be, but notes it is not painful today.  She has been under significant stress recently due to her father's diagnosis of stage four cancer last week, and she is responsible for taking her parents to medical appointments. Additionally, she cares for her two-year-old granddaughter Monday through Friday. She wonders if stress or anxiety might be related to the lymph node issue.  There is no family history of breast cancer, and her father is the first in the family to have cancer. She reports a history of 'puffy nodulistic breasts,' which she has always  had.  No recent illnesses, fevers, chills, or new vaccines.     History reviewed. No pertinent past medical history.  Medications: Outpatient Medications Prior to Visit  Medication Sig   ALPRAZolam  (XANAX ) 0.5 MG tablet Take 0.5-1 tablets (0.25-0.5 mg total) by mouth at bedtime as needed for anxiety.   levothyroxine  (SYNTHROID ) 137 MCG tablet Take 1 tablet (137 mcg total) by mouth daily before breakfast.   sertraline  (ZOLOFT ) 100 MG tablet Take 1 tablet (100 mg total) by mouth daily.   No facility-administered medications prior to visit.    Review of Systems      Objective    BP 97/85   Pulse 87   Ht 5\' 7"  (1.702 m)   Wt 153 lb (69.4 kg)   SpO2 100%   BMI 23.96 kg/m      Physical Exam Chest:         No results found for any visits on 05/05/24.  Assessment & Plan     Problem List Items Addressed This Visit   None Visit Diagnoses       Enlarged lymph node    -  Primary   Relevant Orders   MM 3D DIAGNOSTIC MAMMOGRAM BILATERAL BREAST   US  BREAST COMPLETE UNI RIGHT INC AXILLA   US  BREAST COMPLETE UNI LEFT INC AXILLA        Assessment &  Plan Enlarged lymph node under right arm Enlarged, mobile lymph node under the right arm without associated symptoms such as fever, chills, or recent vaccinations. No significant family history of breast cancer. No breast lumps or nodules detected. Differential diagnosis includes benign reactive lymphadenopathy possibly due to friction or stress. Anxiety due to family stressors may contribute to concern about the lymph node. - Order breast ultrasound and diagnostic mammogram for both breasts - Instruct her to wait for scheduling call for imaging - Advise her to call the office if not contacted by Friday     No follow-ups on file.         Mimi Alt, MD  [facility] [phone] (phone) [phone] (fax)  [facility]

## 2024-05-06 ENCOUNTER — Encounter: Payer: Self-pay | Admitting: Family Medicine

## 2024-05-20 ENCOUNTER — Other Ambulatory Visit

## 2024-05-20 ENCOUNTER — Encounter

## 2024-06-16 ENCOUNTER — Encounter

## 2024-06-16 ENCOUNTER — Other Ambulatory Visit

## 2024-07-07 ENCOUNTER — Ambulatory Visit
Admission: RE | Admit: 2024-07-07 | Discharge: 2024-07-07 | Disposition: A | Discharge status: Home / Self Care | Source: Ambulatory Visit | Attending: Family Medicine | Admitting: Family Medicine

## 2024-07-07 ENCOUNTER — Ambulatory Visit: Payer: Self-pay | Admitting: Family Medicine

## 2024-07-07 DIAGNOSIS — R599 Enlarged lymph nodes, unspecified: Secondary | ICD-10-CM | POA: Diagnosis not present

## 2024-07-07 DIAGNOSIS — N6311 Unspecified lump in the right breast, upper outer quadrant: Secondary | ICD-10-CM | POA: Diagnosis not present

## 2024-07-07 DIAGNOSIS — R92323 Mammographic fibroglandular density, bilateral breasts: Secondary | ICD-10-CM | POA: Diagnosis not present

## 2024-07-21 ENCOUNTER — Other Ambulatory Visit: Payer: Self-pay | Admitting: Family Medicine

## 2024-08-26 ENCOUNTER — Other Ambulatory Visit: Payer: Self-pay | Admitting: Family Medicine

## 2024-08-26 DIAGNOSIS — F411 Generalized anxiety disorder: Secondary | ICD-10-CM

## 2024-10-14 ENCOUNTER — Other Ambulatory Visit: Payer: Self-pay | Admitting: Family Medicine

## 2024-10-16 NOTE — Telephone Encounter (Signed)
 Requested medications are due for refill today.  yes  Requested medications are on the active medications list.  yes  Last refill. 07/22/2024 #90 0 rf  Future visit scheduled.   no  Notes to clinic.  Labs are expired.    Requested Prescriptions  Pending Prescriptions Disp Refills   levothyroxine  (SYNTHROID ) 137 MCG tablet [Pharmacy Med Name: Levothyroxine  Sodium 137 MCG Oral Tablet] 90 tablet 3    Sig: TAKE 1 TABLET BY MOUTH DAILY  BEFORE BREAKFAST (APPOINTMENT  NEEDED FOR FURTHER REFILLS)     Endocrinology:  Hypothyroid Agents Failed - 10/16/2024  1:00 PM      Failed - TSH in normal range and within 360 days    TSH  Date Value Ref Range Status  07/08/2023 0.667 0.450 - 4.500 uIU/mL Final         Failed - Valid encounter within last 12 months    Recent Outpatient Visits           5 months ago Enlarged lymph node    [facility] Simmons-Robinson, [place], MD   6 months ago Mass of left lower leg   [id number] [place] Nancyann BRAVO, MD   7 months ago Diarrhea, unspecified type   [facility] [place], Jon HERO, MD

## 2024-11-10 ENCOUNTER — Encounter: Payer: Self-pay | Admitting: Family Medicine

## 2024-11-10 ENCOUNTER — Other Ambulatory Visit: Payer: Self-pay

## 2024-11-10 DIAGNOSIS — F411 Generalized anxiety disorder: Secondary | ICD-10-CM

## 2024-11-10 MED ORDER — SERTRALINE HCL 100 MG PO TABS: 100.0000 mg | ORAL_TABLET | Freq: Every day | ORAL | 0 refills | Status: DC

## 2024-11-13 MED ORDER — SERTRALINE HCL 100 MG PO TABS: 100.0000 mg | ORAL_TABLET | Freq: Every day | ORAL | 0 refills | Status: AC

## 2024-12-18 ENCOUNTER — Other Ambulatory Visit: Payer: Self-pay | Admitting: Family Medicine
